# Patient Record
Sex: Male | Born: 1985
Health system: Southern US, Community
[De-identification: ages and names within clinical notes are randomized; demographics above are authoritative.]

## PROBLEM LIST (undated history)

## (undated) DIAGNOSIS — J302 Other seasonal allergic rhinitis: Secondary | ICD-10-CM

## (undated) DIAGNOSIS — F419 Anxiety disorder, unspecified: Secondary | ICD-10-CM

## (undated) DIAGNOSIS — F1910 Other psychoactive substance abuse, uncomplicated: Secondary | ICD-10-CM

## (undated) HISTORY — DX: Other seasonal allergic rhinitis: J30.2

## (undated) HISTORY — DX: Other psychoactive substance abuse, uncomplicated: F19.10

## (undated) HISTORY — DX: Anxiety disorder, unspecified: F41.9

---
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
## 2005-01-22 ENCOUNTER — Encounter: Admission: RE | Admit: 2005-01-22 | Discharge: 2005-01-22 | Payer: Self-pay | Admitting: Specialist

## 2006-08-31 IMAGING — RF DG FL GUIDE NDL PLMT  - NRPT [facility]
4 series · 4 of 4 positions shown · IV contrast (omniscan)
Comparison: none

CLINICAL DATA: Shoulder pain with tennis serving.  Assess for labral tear or cuff tear. 
 LEFT SHOULDER INJECTION FOR MRI:
 The skin anterior to the left shoulder was cleansed and anesthetized.  A 22 gauge spinal needle was directed into the shoulder joint.  10cc of fluid consisting of iodinated contrast, Omniscan, and 1% Lidocaine were injected.  I did filming of the shoulder and internal and external rotation including abduction films.  The undersurface of the rotator cuff appears smooth.

[Series 1: arthrogram · 1 of 1 slices shown (1 of 4)]
[im 1/1]
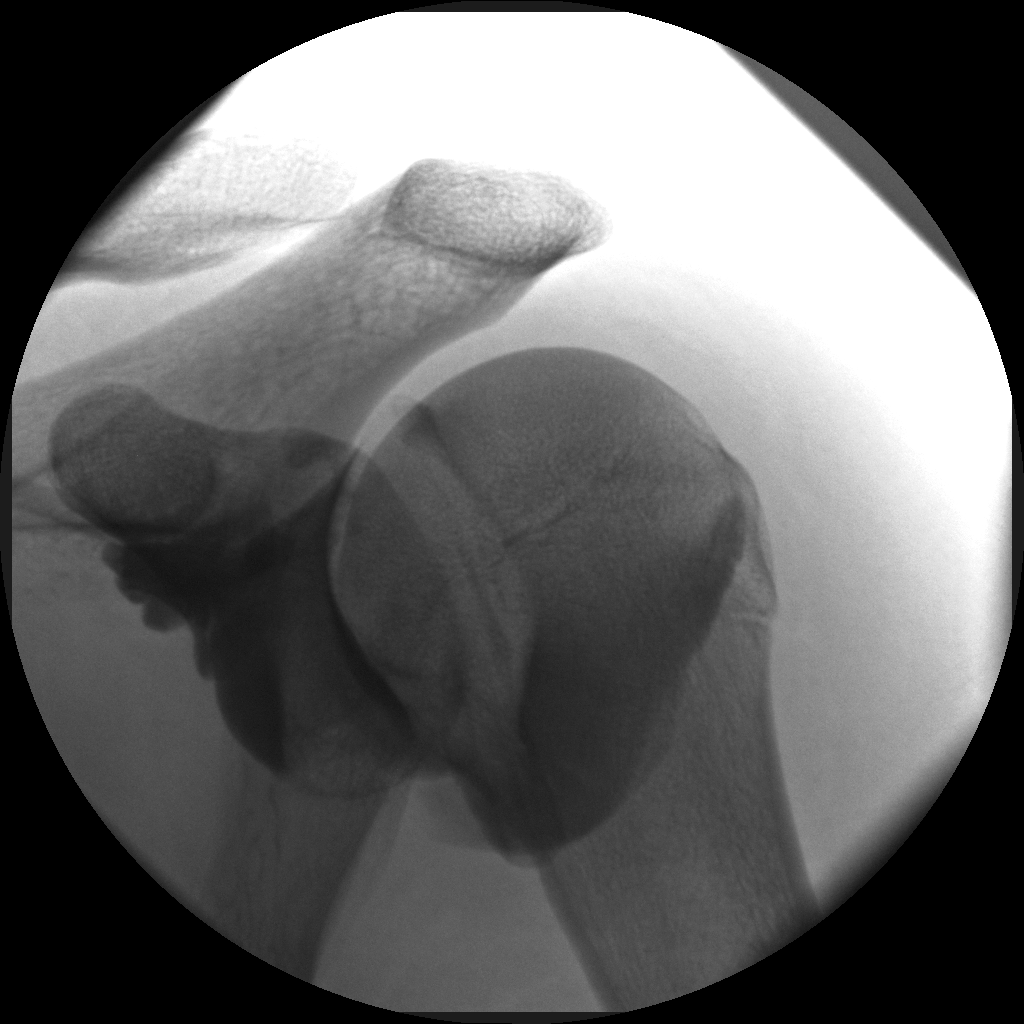

[Series 2: arthrogram · 1 of 1 slices shown (2 of 4)]
[im 1/1]
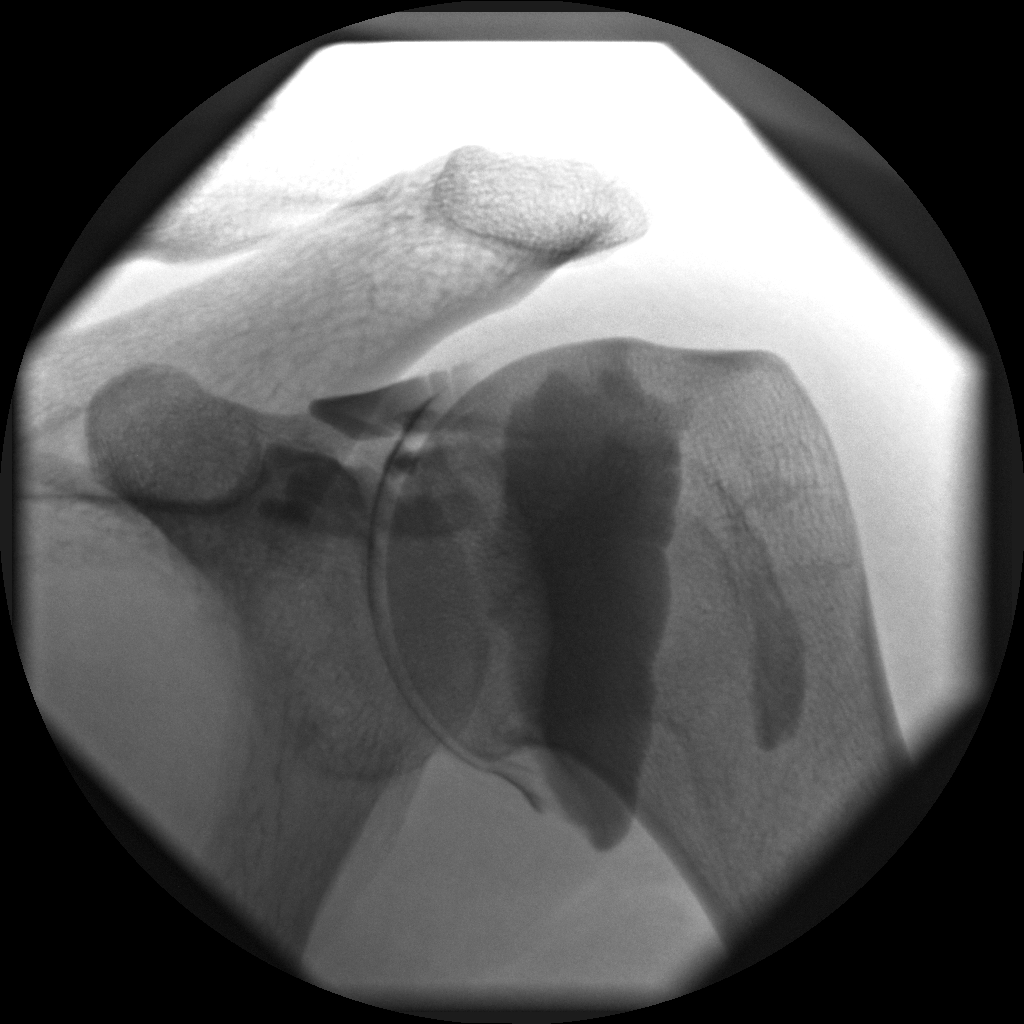

[Series 3: arthrogram · 1 of 1 slices shown (3 of 4)]
[im 1/1]
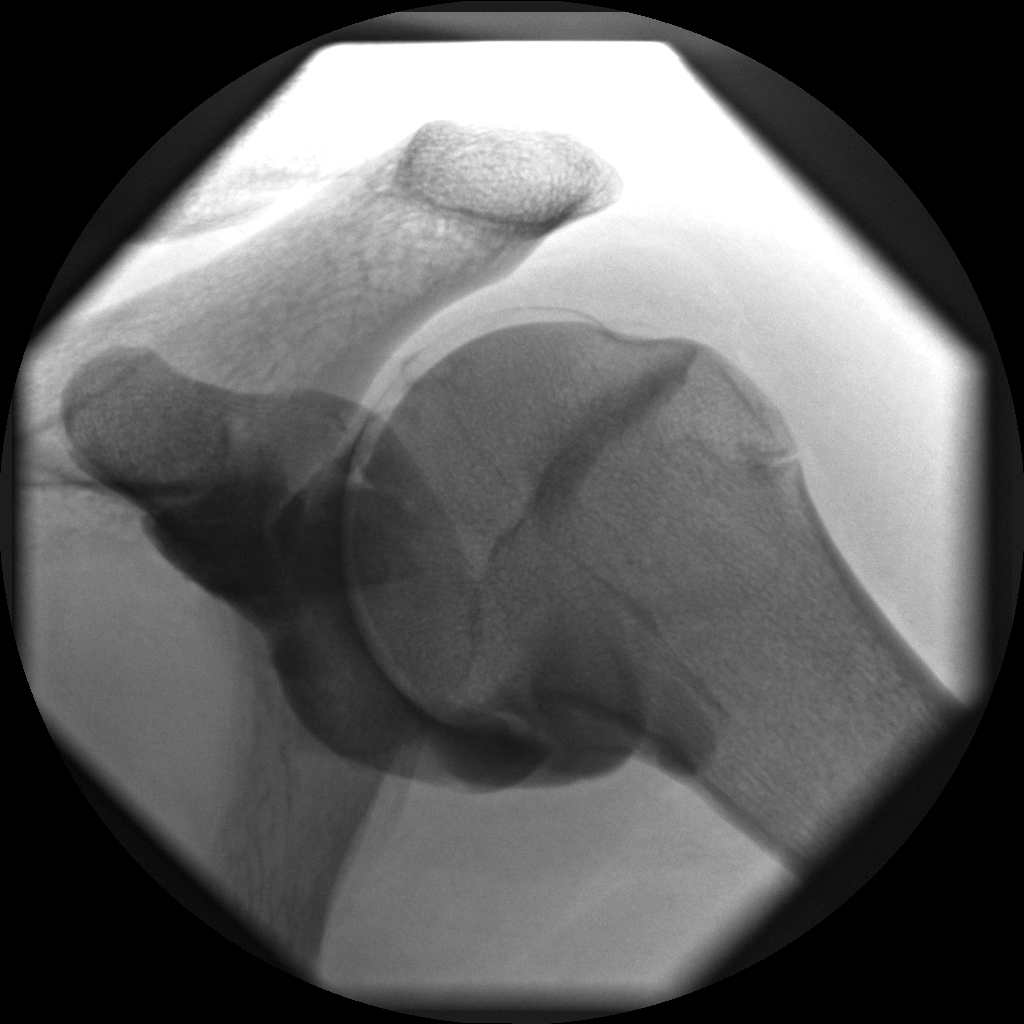

[Series 4: arthrogram · 1 of 1 slices shown (4 of 4)]
[im 1/1]
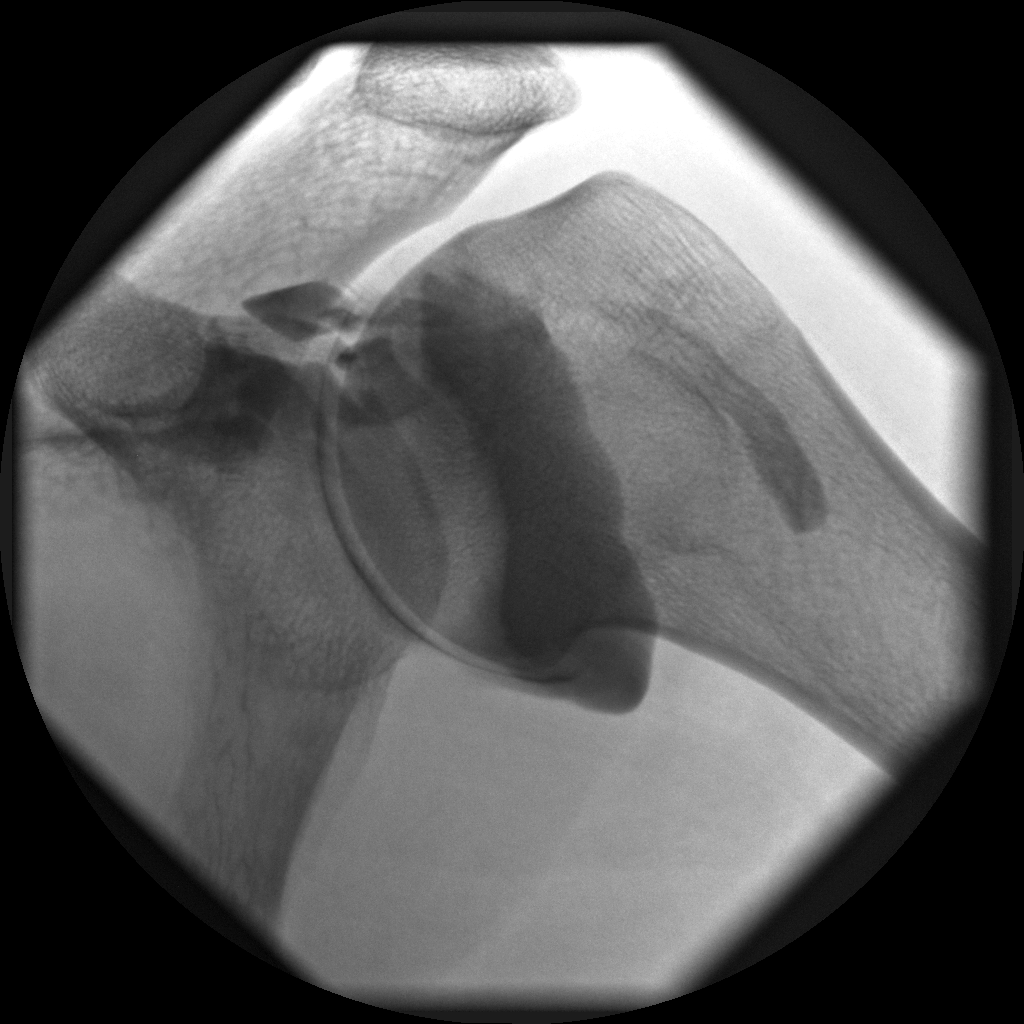

[4 of 4 positions shown; findings below may reference images not displayed]

IMPRESSION: 1.  Shoulder injection for MRI showing a normal undersurface of the rotator cuff.

## 2008-01-01 ENCOUNTER — Ambulatory Visit: Payer: Self-pay | Admitting: Family Medicine

## 2008-01-07 ENCOUNTER — Ambulatory Visit: Payer: Self-pay | Admitting: Family Medicine

## 2010-07-02 ENCOUNTER — Encounter: Payer: Self-pay | Admitting: Specialist

## 2014-05-27 ENCOUNTER — Encounter: Payer: Self-pay | Admitting: Family Medicine

## 2014-05-27 ENCOUNTER — Ambulatory Visit (INDEPENDENT_AMBULATORY_CARE_PROVIDER_SITE_OTHER): Payer: BLUE CROSS/BLUE SHIELD | Admitting: Family Medicine

## 2014-05-27 ENCOUNTER — Other Ambulatory Visit: Payer: Self-pay | Admitting: Family Medicine

## 2014-05-27 VITALS — BP 104/62 | HR 80 | Ht 69.0 in | Wt 208.0 lb

## 2014-05-27 DIAGNOSIS — R748 Abnormal levels of other serum enzymes: Secondary | ICD-10-CM

## 2014-05-27 DIAGNOSIS — F410 Panic disorder [episodic paroxysmal anxiety] without agoraphobia: Secondary | ICD-10-CM

## 2014-05-27 DIAGNOSIS — Z720 Tobacco use: Secondary | ICD-10-CM

## 2014-05-27 DIAGNOSIS — F411 Generalized anxiety disorder: Secondary | ICD-10-CM

## 2014-05-27 DIAGNOSIS — R002 Palpitations: Secondary | ICD-10-CM

## 2014-05-27 DIAGNOSIS — Z113 Encounter for screening for infections with a predominantly sexual mode of transmission: Secondary | ICD-10-CM

## 2014-05-27 DIAGNOSIS — Z23 Encounter for immunization: Secondary | ICD-10-CM

## 2014-05-27 DIAGNOSIS — K219 Gastro-esophageal reflux disease without esophagitis: Secondary | ICD-10-CM

## 2014-05-27 DIAGNOSIS — Z13220 Encounter for screening for lipoid disorders: Secondary | ICD-10-CM

## 2014-05-27 LAB — CBC WITH DIFFERENTIAL/PLATELET
BASOS ABS: 0 10*3/uL (ref 0.0–0.1)
Basophils Relative: 0 % (ref 0–1)
Eosinophils Absolute: 0.1 10*3/uL (ref 0.0–0.7)
Eosinophils Relative: 1 % (ref 0–5)
HEMATOCRIT: 45.2 % (ref 39.0–52.0)
HEMOGLOBIN: 15.5 g/dL (ref 13.0–17.0)
LYMPHS PCT: 37 % (ref 12–46)
Lymphs Abs: 2.2 10*3/uL (ref 0.7–4.0)
MCH: 28.5 pg (ref 26.0–34.0)
MCHC: 34.3 g/dL (ref 30.0–36.0)
MCV: 83.2 fL (ref 78.0–100.0)
MONOS PCT: 9 % (ref 3–12)
MPV: 10.8 fL (ref 9.4–12.4)
Monocytes Absolute: 0.5 10*3/uL (ref 0.1–1.0)
NEUTROS ABS: 3.2 10*3/uL (ref 1.7–7.7)
NEUTROS PCT: 53 % (ref 43–77)
PLATELETS: 216 10*3/uL (ref 150–400)
RBC: 5.43 MIL/uL (ref 4.22–5.81)
RDW: 13.3 % (ref 11.5–15.5)
WBC: 6 10*3/uL (ref 4.0–10.5)

## 2014-05-27 MED ORDER — ESCITALOPRAM OXALATE 10 MG PO TABS: 10.0000 mg | ORAL_TABLET | Freq: Every day | ORAL | Status: DC

## 2014-05-27 NOTE — Progress Notes (Signed)
Chief Complaint  Patient presents with  . Palpitations    over the past 2 months has been experiencing heart palpitations, thinks it may be from anxiety. Feels like his heart stops when he is sleeping. Has had panic attacks. Under lots of stress. [facility]states [facility] went [facility] and was given xanax-took two and problems went away.   Marland Kitchen. Heartburn    after he eats, always-wonders if he may have an ulcer. States [employer] are about the only thing that he doesn't have a problem after eating. Taking zantac otc.   Patient presents with complaints of anxiety and reflux.  His parents are both my patients, and he presents also to establish care.  He noticed some skipped beats at night while sleeping, which then made him anxious, and heart was beating fast.  Sometimes he felt like it "stopped" beating.  Once he even felt like his arms and head went numb, had trouble breathing, couldn't really move for a few seconds.  After this happened a couple of times, he had a full panic attack--he felt like he was going to pass out, his left arm was completely numb (as he was worried about having a heart attack). He had chest tightness, shortness of breath.  He sat down, slowly was breathing, and was able to calm down some.  After that, he was having issues while awake--had some palpitations, started getting very anxious, and that is when he went to the urgent care. He was given xanax to use at bedtime for anxiety.  He took it just twice, and his anxiety is much better. He is still very concerned that he has heart issues. He admits to being a worrier "like my father" and has some chronic, daily anxiety. 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  He took paxil for about 2 years for anxiety.  He has been off for about a year.  He weaned himself off of it because he felt like it brought him down too far--felt sleepy, more subdued  sexually. Had decreased libido. He cannot recall the dose.  He is having ongoing issues with heartburn, over the last 1.5 years.  He needs to take zantac every day, rarely needs to use a second pill.  Denies dysphagia. +caffeine, alcohol, acidic foods, eating late at night. 2 diet cokes/day (sometimes each is very large); caffeinated energy drink 4x/week.  Past Medical History  Diagnosis Date  . Anxiety    History reviewed. No pertinent past surgical history. History   Social History  . Marital Status: Single    Spouse Name: N/A    Number of Children: N/A  . Years of Education: N/A   Occupational History  . tennis coach, Secondary school teacherinstructor of public speaking at [employer]    Social History Main Topics  . Smoking status: Current Every Day Smoker -- 0.30 packs/day    Types: Cigarettes  . Smokeless tobacco: Never Used     Comment: down to 4-6 cigarettes/day; started smoking age 28  . Alcohol Use: 0.0 oz/week    0 Not specified per week     Comment: 2-3 days a week:4-5 drinks each time  . Drug Use: No  . Sexual Activity:    Partners: Male   Other Topics Concern  . Not on file   Social History Narrative   Lives with male partner.  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   No pets.   +smoker   Family History  Problem Relation Age of Onset  . Hypertension Mother   . Transient ischemic attack Father   . Heart disease Maternal Grandfather   . Cancer Paternal Grandmother    Outpatient Encounter Prescriptions as of 05/27/2014  Medication Sig Note  . ALPRAZolam (XANAX) 0.5 MG tablet Take 0.5 mg by mouth daily.  05/27/2014: From [facility] about 1 week ago, rx'd #30.  Has taken it x 2 nights, at bedtime  . ibuprofen (ADVIL,MOTRIN) 200 MG tablet Take 600 mg by mouth as needed. 05/27/2014: Takes about once a week.  . ranitidine (ZANTAC) 150 MG tablet Take 150 mg by mouth daily.   . simethicone (MYLICON) 80 MG chewable tablet Chew  80 mg by mouth every 6 (six) hours as needed for flatulence.    No Known Allergies  ROS:  No fevers, chills, headaches, chest pain (except with panic attack).  Numbness/tingling only related to anxiety/hyperventilation.  Denies URI symptoms, cough, shortness of breath with exertion.  +anxiety and reflux as per HPI. He has concern over possible ulcer--he doesn't have any abdominal pain, or symptoms on empty stomach; no black/bloody bowel movements.  No dysphagia. No nausea, vomiting, diarrhea.  Denies depression. No bleeding, bruising, skin lesions/rashes. No swollen glands, joint pain (other than mild chronic issues related to overuse from tennis--no acute problem).  PHYSIAL EXAM: BP 104/62 mmHg  Pulse 80  Ht 5\' 9"  (1.753 m)  Wt 208 lb (94.348 kg)  BMI 30.70 kg/m2 Pleasant, talkative overweight male--only mildly anxious HEENT: PERRL, EOMI, conjunctiva clear.  OP clear. Neck: no lymphadenopathy, thyromegaly or mass Heart: regular rate and rhythm without murmur Lungs: clear bilaterally Back: no CVA or spinal tenderness Abdomen: soft, nontender, no organomegaly or mass Extremities: no edema, 2+ pulse Psych: normal affect, hygiene and grooming.  Normal eye contact and speech Neuro: alert and oriented.  Cranial nerves intact.  2+ DTR's, normal strength, sensation, gait  ASSESSMENT/PLAN:  Generalized anxiety disorder - encouraged counseling, stress reduction techniques. use xanax sparingly.  start lexapro--risks/side effects reviewed.  f/u 4-6 wks - Plan: escitalopram (LEXAPRO) 10 MG tablet  Need for prophylactic vaccination and inoculation against influenza - Plan: Flu Vaccine QUAD 36+ mos PF IM (Fluarix Quad PF)  Palpitations - Plan: Comprehensive metabolic panel, TSH, CBC with Differential  Panic attack  Screening for lipid disorders - Plan: Lipid panel  Screening for STD (sexually transmitted disease) - Plan: HIV antibody (with reflex)  Gastroesophageal reflux disease without  esophagitis - reviewed diet and behavioral changes in detail, as well as OTC meds (PPI vs H2).  Tobacco abuse - reviewed risks; encouraged cessation.  counseled extensively re: available options to help quit, risks, and risks of e-cigs  Counseling encouraged (through [employer]) Counseled extensively re: stress reduction, alprazolam risks, alcohol risks/recommendations, smoking risks and cessation. Counseled re: SSRI risks/side effects. Counseled re: reflux precautions, diet, risks of untreated reflux  lexapro 10mg --Start at 1/2 tablet once daily. After a week, increase to the full tablet.  If you don't tolerate the full tablet, you can continue with 1/2 tablet for longer.  Use the alprazolam/xanax sparingly, only for severe anxiety.  If you need to take it during the day, only use 1/2 tablet and use caution with driving.   F/u as scheduled at [facility] in February, sooner if needed  Total visit time was 50 minutes, more than 1/2 spent counseling

## 2014-05-27 NOTE — Patient Instructions (Signed)
  Lexapro 10mg --start at 1/2 tablet once daily. After a week, increase to the full tablet.  If you don't tolerate the full tablet, you can continue with 1/2 tablet for longer.  Use the alprazolam/xanax sparingly, only for severe anxiety.  If you need to take it during the day, only use 1/2 tablet and use caution with driving.  Please try and quit smoking--start thinking about why/when you smoke (habit, boredom, stress) in order to come up with effective strategies to cut back or quit. Available resources to help you quit include free counseling through [facility] ([place]uit[place] or 1-800-QUITNOW), smoking cessation classes through [facility] (call to find out schedule), over-the-counter nicotine replacements, and e-cigarettes (although this may not help break the hand-mouth habit).  Many insurance companies also have smoking cessation programs (which may decrease the cost of patches, meds if enrolled).  If these methods are not effective for you, and you are motivated to quit, return to discuss the possibility of prescription medications.  Food Choices for Gastroesophageal Reflux Disease When you have gastroesophageal reflux disease (GERD), the foods you eat and your eating habits are very important. Choosing the right foods can help ease the discomfort of GERD. WHAT GENERAL GUIDELINES DO I NEED TO FOLLOW?  Choose fruits, vegetables, whole grains, low-fat dairy products, and low-fat meat, fish, and poultry.  Limit fats such as oils, salad dressings, butter, nuts, and avocado.  Keep a food diary to identify foods that cause symptoms.  Avoid foods that cause reflux. These may be different for different people.  Eat frequent small meals instead of three large meals each day.  Eat your meals slowly, in a relaxed setting.  Limit fried foods.  Cook foods using methods other than frying.  Avoid drinking alcohol.  Avoid drinking large amounts of liquids with your  meals.  Avoid bending over or lying down until 2-3 hours after eating. WHAT FOODS ARE NOT RECOMMENDED? The following are some foods and drinks that may worsen your symptoms: Vegetables Tomatoes. Tomato juice. Tomato and spaghetti sauce. Chili peppers. Onion and garlic. Horseradish. Fruits Oranges, grapefruit, and lemon (fruit and juice). Meats High-fat meats, fish, and poultry. This includes hot dogs, ribs, ham, sausage, salami, and bacon. Dairy Whole milk and chocolate milk. Sour cream. Cream. Butter. Ice cream. Cream cheese.  Beverages Coffee and tea, with or without caffeine. Carbonated beverages or energy drinks. Condiments Hot sauce. Barbecue sauce.  Sweets/Desserts Chocolate and cocoa. Donuts. Peppermint and spearmint. Fats and Oils High-fat foods, including [place] fries and potato chips. Other Vinegar. Strong spices, such as black pepper, white pepper, red pepper, cayenne, curry powder, cloves, ginger, and chili powder. The items listed above may not be a complete list of foods and beverages to avoid. Contact your dietitian for more information. Document Released: 05/28/2005 Document Revised: 06/02/2013 Document Reviewed: 04/01/2013 [facility] Patient Information 2015 [employer] [place]. This information is not intended to replace advice given to you by your health care provider. Make sure you discuss any questions you have with your health care provider.

## 2014-05-28 DIAGNOSIS — Z720 Tobacco use: Secondary | ICD-10-CM | POA: Insufficient documentation

## 2014-05-28 DIAGNOSIS — F410 Panic disorder [episodic paroxysmal anxiety] without agoraphobia: Secondary | ICD-10-CM | POA: Insufficient documentation

## 2014-05-28 DIAGNOSIS — F411 Generalized anxiety disorder: Secondary | ICD-10-CM | POA: Insufficient documentation

## 2014-05-28 DIAGNOSIS — K219 Gastro-esophageal reflux disease without esophagitis: Secondary | ICD-10-CM | POA: Insufficient documentation

## 2014-05-28 DIAGNOSIS — R748 Abnormal levels of other serum enzymes: Secondary | ICD-10-CM | POA: Insufficient documentation

## 2014-05-28 LAB — LIPID PANEL
CHOL/HDL RATIO: 3.3 ratio
CHOLESTEROL: 158 mg/dL (ref 0–200)
HDL: 48 mg/dL (ref >39)
LDL CALC: 92 mg/dL (ref 0–99)
TRIGLYCERIDES: 92 mg/dL (ref <150)
VLDL: 18 mg/dL (ref 0–40)

## 2014-05-28 LAB — COMPREHENSIVE METABOLIC PANEL
ALBUMIN: 4.2 g/dL (ref 3.5–5.2)
ALT: 64 U/L — AB (ref 0–53)
AST: 184 U/L — AB (ref 0–37)
Alkaline Phosphatase: 65 U/L (ref 39–117)
BUN: 12 mg/dL (ref 6–23)
CO2: 25 meq/L (ref 19–32)
Calcium: 9.5 mg/dL (ref 8.4–10.5)
Chloride: 106 mEq/L (ref 96–112)
Creat: 1 mg/dL (ref 0.50–1.35)
GLUCOSE: 86 mg/dL (ref 70–99)
POTASSIUM: 4.6 meq/L (ref 3.5–5.3)
Sodium: 139 mEq/L (ref 135–145)
TOTAL PROTEIN: 6.9 g/dL (ref 6.0–8.3)
Total Bilirubin: 0.6 mg/dL (ref 0.2–1.2)

## 2014-05-28 LAB — HIV ANTIBODY (ROUTINE TESTING W REFLEX): HIV: NONREACTIVE

## 2014-05-28 LAB — TSH: TSH: 1.516 u[IU]/mL (ref 0.350–4.500)

## 2014-05-28 NOTE — Addendum Note (Signed)
Addended byJoselyn Arrow: Kelcie Currie on: 05/28/2014 02:47 PM   Modules accepted: Orders

## 2014-05-31 LAB — HEPATITIS C ANTIBODY: HCV AB: NEGATIVE

## 2014-05-31 LAB — HEPATITIS B SURFACE ANTIBODY,QUALITATIVE

## 2014-05-31 LAB — HEPATITIS B SURFACE ANTIGEN: HEP B S AG: NEGATIVE

## 2014-08-09 ENCOUNTER — Encounter: Payer: Self-pay | Admitting: Family Medicine

## 2014-08-09 DIAGNOSIS — Z0000 Encounter for general adult medical examination without abnormal findings: Secondary | ICD-10-CM

## 2014-09-06 ENCOUNTER — Encounter: Payer: Self-pay | Admitting: Family Medicine

## 2014-11-02 ENCOUNTER — Telehealth: Payer: Self-pay | Admitting: Family Medicine

## 2014-11-02 NOTE — Telephone Encounter (Signed)
No.  He can schedule a sooner appointment (there are openings 6/1, or we can do a 30 min visit this Friday. He no showed his physical, so never followed up on Lexapro since it was started in December

## 2014-11-02 NOTE — Telephone Encounter (Signed)
[facility]

## 2014-11-02 NOTE — Telephone Encounter (Signed)
Pt says Lexapro is not working. He would like to get script for Paroxetine if possible so made an appt to see Dr Lynelle DoctorKnapp to discuss this. Wants to know if he can get a few Xanax again for the time being for panic attacks.

## 2014-11-05 ENCOUNTER — Encounter: Payer: Self-pay | Admitting: Family Medicine

## 2014-11-05 ENCOUNTER — Ambulatory Visit (INDEPENDENT_AMBULATORY_CARE_PROVIDER_SITE_OTHER): Payer: BLUE CROSS/BLUE SHIELD | Admitting: Family Medicine

## 2014-11-05 VITALS — BP 124/72 | HR 60 | Ht 69.0 in | Wt 198.6 lb

## 2014-11-05 DIAGNOSIS — F411 Generalized anxiety disorder: Secondary | ICD-10-CM | POA: Diagnosis not present

## 2014-11-05 DIAGNOSIS — R748 Abnormal levels of other serum enzymes: Secondary | ICD-10-CM

## 2014-11-05 DIAGNOSIS — K219 Gastro-esophageal reflux disease without esophagitis: Secondary | ICD-10-CM

## 2014-11-05 DIAGNOSIS — Z720 Tobacco use: Secondary | ICD-10-CM

## 2014-11-05 MED ORDER — ALPRAZOLAM 0.5 MG PO TABS: 0.5000 mg | ORAL_TABLET | Freq: Three times a day (TID) | ORAL | Status: DC | PRN

## 2014-11-05 MED ORDER — BUSPIRONE HCL 15 MG PO TABS: ORAL_TABLET | ORAL | Status: DC

## 2014-11-05 MED ORDER — DEXLANSOPRAZOLE 60 MG PO CPDR: 60.0000 mg | DELAYED_RELEASE_CAPSULE | Freq: Every day | ORAL | Status: DC

## 2014-11-05 NOTE — Progress Notes (Signed)
Chief Complaint  Patient presents with  . Follow-up    would like to have repeat liver tests possibly by next Friday.  Also to follow up on anxiety/panic attacks-has worsened since last visit.    After he eats, especially after a large meal, or an unhealthy meal, he gets a discomfort in his chest, feels like the food isn't being digested properly.  He stands up, and keeps burping, until it clears.  Drinking water doesn't help, sometimes makes it worse, if he is already full. He feels like this triggers anxiety.  He also has anxiety when he tries to lay down to sleep--he needs to rub the chest, and get up and walk around to "work it out".  Sometimes he walks around until morning.  This has been going on for 2-2.5 weeks.  He has been using the zantac just once or twice a week--helps some with the burning, but not helping with the discomfort lower in the chest or the anxiety. Heart rate sometimes speeds up some when feeling the discomfort and anxiety--not extremely fast or irregular.  He is feeling anxious all the time.  He has woken up twice with anxiety, and then it tends to last throughout the day.  He uses "anxiety" and the chest discomfort fairly synonymously in talking today, but then also describes feeling like it is hard to take a breath, chest is tight, heart beats faster.  He can sometimes talk himself out of it, so is aware it is "all in his head".  Once felt like his equilibrium was a little off, which made him feel more anxious.  We started him on Lexapro in December for the same concerns. He took it only for about 5-6 weeks, and then stopped it because he felt like it made him more depressed.  He felt "nothing in the world was good."  No longer having any problems with feeling down.  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 Always able to function, even during finals.  But as everything wraps up, he gets more anxious, has trouble sleeping.  Same thing happened  with end of semester in the past.  He took paxil for about 2 years for anxiety. He has been off for over a year and a half. He weaned himself off of it because he felt like it brought him down too far--felt sleepy, more subdued sexually. Had decreased libido. He cannot recall the dose. He had originally called asking to get switched back to Paxil to treat his anxiety (until reminded of why he told me he stopped it).  He continues to smoke a few cigarettes a day, more if drinking. Trying to use e-cigarette more.  He continues to drink the same amount of alcohol. He never returned for repeat testing, was waiting until he had a time that he didn't drink much alcohol for 2 weeks. Last drink was 3 shots 3 days ago.  He was supposed to return IN 2 weeks, and to avoid drinking for those 2 weeks, to repeat liver tests (in January).  He misunderstood, and was waiting until there was a time that he didn't drink for 2 weeks (which hasn't happened).  He otherwise only drinks water, other fluids make him have acid reflux. His diet has been healthier, getting regular exercise.  PMH, PSH, SH, FH reviewed/updated  Outpatient Encounter Prescriptions as of 11/05/2014  Medication Sig Note  . ALPRAZolam (XANAX) 0.5 MG tablet Take 0.5 mg by mouth daily.  05/27/2014: From [facility] about 1 week ago, rx'd #30.  Has taken it x 2 nights, at bedtime  . escitalopram (LEXAPRO) 10 MG tablet Take 1 tablet (10 mg total) by mouth daily. (Patient not taking: Reported on 11/05/2014) 11/05/2014: Only took it for 5-6 weeks  . ibuprofen (ADVIL,MOTRIN) 200 MG tablet Take 600 mg by mouth as needed. 05/27/2014: Takes about once a week.  . ranitidine (ZANTAC) 150 MG tablet Take 150 mg by mouth daily. 11/05/2014: Uses prn, once or twice a week. Needing less since eating better  . simethicone (MYLICON) 80 MG chewable tablet Chew 80 mg by mouth every 6 (six) hours as needed for flatulence. 11/05/2014: Uses prn   No  facility-administered encounter medications on file as of 11/05/2014.   No Known Allergies  ROS: no headaches, dizziness, fainting spells, numbness, tingling, URI symptoms, shortness of breath (except associated with palpitations and anxiety).  No vomiting or diarrhea, + reflux as per HPI. No urinary complaints. No bleeding, bruising, rash. Down 10# since his last visit, eating healthier,exercising more  PHYSICAL EXAM: BP 124/72 mmHg  Pulse 60  Ht  (1.753 m)  Wt 198 lb 9.6 oz (90.084 kg)  BMI 29.31 kg/m2 Well developed, pleasant, somewhat anxious, talkative male in no distress HEENT: PERRL, EOMI, conjunctiva clear. OP: mild erythema of anterior tonsillar pillars bilaterally, otherwise normal. Heart: regular rate and rhythm without murmur Lungs: clear bilateraelly Abdomen: soft, no organomegaly or mass. Mild epigastric tenderness Extremities: no edema Neuro: alert and oriented.  Cranial nerves, strength and gait are normal Psych: slightly anxious. Full range of affect. Normal eye contact, speech, hygiene and grooming  ASSESSMENT/PLAN:  Generalized anxiety disorder - intolerant of Lexapro and Paxil. Try Buspar, encouraged counseling. - Plan: busPIRone (BUSPAR) 15 MG tablet, ALPRAZolam (XANAX) 0.5 MG tablet  Gastroesophageal reflux disease, esophagitis presence not specified - diet reviewed; decrease alcohol, smoking; use Dexilant daily x2 wks, then OTC PPI prn. chest pain likely triggering anxiety - Plan: dexlansoprazole (DEXILANT) 60 MG capsule  Elevated liver enzymes - repeat next week (declines today); risks of excessive alcohol reviewed in detail, encouraged to decrease  Tobacco abuse - strongly encouraged cessation   Buspar . Start at 1/2 tablet twice daily (if too many side effects, can decrease to 1/3 tablet ( ) twice daily, and gradually increase to 3/day, and increase the dose by 5-7.5mg  each week until you get to  twice daily.  Hold it at  twice daily, and  then follow up. Call sooner if you aren't tolerating or getting any benefit at all.  This is for the treatment of anxiety.  It is a completely different class of medication than the Paxil or Lexapro.  I highly recommend counseling to help you learn coping skills for when you are feeling anxious.  Use the alprazolam only if needed for a panic attack, or to help decrease the anxiety so you can fall asleep.  This is not intended for regular use.  Reflux--I recommend taking Dexilant once daily (in the morning, before breakfast) every day for the length of the samples.  If your symptoms are improving (heartburn, chest pressure, trouble with eating) then that means that acid reflux is a contributing factor.  When you finish the samples, start taking an over-the-counter Nexium or Prilosec once daily, every day.  You don't need to take the zantac while on these other medication.  If the OTC medication isn't effective, then call us for a prescription--the prescription will be for Nexium , not  for Dexilant simply because the dexilant won't be covered.  Continue proper diet for reflux.  Smoking cessation reviewed Alcohol recommendations reviewed.  Please quit smoking. Please cut back on drinking.   Both of these affect your reflux The drinking is also affecting/harming your liver, and can interfere with sleep.

## 2014-11-05 NOTE — Patient Instructions (Addendum)
Buspar . Start at 1/2 tablet twice daily (if too many side effects, can decrease to 1/3 tablet ( ) twice daily, and gradually increase to 3/day, and increase the dose by 5-7.5mg  each week until you get to  twice daily.  Hold it at  twice daily, and then follow up. Call sooner if you aren't tolerating or getting any benefit at all.  This is for the treatment of anxiety.  It is a completely different class of medication than the Paxil or Lexapro.  I highly recommend counseling to help you learn coping skills for when you are feeling anxious.  Use the alprazolam only if needed for a panic attack, or to help decrease the anxiety so you can fall asleep.  This is not intended for regular use.  Reflux--I recommend taking Dexilant once daily (in the morning, before breakfast) every day for the length of the samples.  If your symptoms are improving (heartburn, chest pressure, trouble with eating) then that means that acid reflux is a contributing factor.  When you finish the samples, start taking an over-the-counter Nexium or Prilosec once daily, every day.  You don't need to take the zantac while on these other medication.  If the OTC medication isn't effective, then call us for a prescription--the prescription will be for Nexium , not for Dexilant simply because the dexilant won't be covered.  Continue proper diet for reflux.  Food Choices for Gastroesophageal Reflux Disease When you have gastroesophageal reflux disease (GERD), the foods you eat and your eating habits are very important. Choosing the right foods can help ease the discomfort of GERD. WHAT GENERAL GUIDELINES DO I NEED TO FOLLOW?  Choose fruits, vegetables, whole grains, low-fat dairy products, and low-fat meat, fish, and poultry.  Limit fats such as oils, salad dressings, butter, nuts, and avocado.  Keep a food diary to identify foods that cause symptoms.  Avoid foods that cause reflux. These may be different for  different people.  Eat frequent small meals instead of three large meals each day.  Eat your meals slowly, in a relaxed setting.  Limit fried foods.  Cook foods using methods other than frying.  Avoid drinking alcohol.  Avoid drinking large amounts of liquids with your meals.  Avoid bending over or lying down until 2-3 hours after eating. WHAT FOODS ARE NOT RECOMMENDED? The following are some foods and drinks that may worsen your symptoms: Vegetables Tomatoes. Tomato juice. Tomato and spaghetti sauce. Chili peppers. Onion and garlic. Horseradish. Fruits Oranges, grapefruit, and lemon (fruit and juice). Meats High-fat meats, fish, and poultry. This includes hot dogs, ribs, ham, sausage, salami, and bacon. Dairy Whole milk and chocolate milk. Sour cream. Cream. Butter. Ice cream. Cream cheese.  Beverages Coffee and tea, with or without caffeine. Carbonated beverages or energy drinks. Condiments Hot sauce. Barbecue sauce.  Sweets/Desserts Chocolate and cocoa. Donuts. Peppermint and spearmint. Fats and Oils High-fat foods, including [place] fries and potato chips. Other Vinegar. Strong spices, such as black pepper, white pepper, red pepper, cayenne, curry powder, cloves, ginger, and chili powder. The items listed above may not be a complete list of foods and beverages to avoid. Contact your dietitian for more information. Document Released: 05/28/2005 Document Revised: 06/02/2013 Document Reviewed: 04/01/2013 [facility] Patient Information 2015 [place], [place]. This information is not intended to replace advice given to you by your health care provider. Make sure you discuss any questions you have with your health care provider.   Please quit smoking. Please cut back on drinking.  Both of these affect your reflux The drinking is also affecting/harming your liver, and can interfere with sleep.

## 2014-11-12 ENCOUNTER — Telehealth: Payer: Self-pay | Admitting: Family Medicine

## 2014-11-12 NOTE — Telephone Encounter (Signed)
Pt. Called in stating that the ALPRAZOLAM (xanax) 0.5mg  is having no effect for his anxiety/panic attacks. Pt. Also stated that he had took two of the pills at one time and still no effect. He is wanting to know should he come in for a possible medication adjustment? Pt. Was last seen 5/27.   Pt. Also stated that the DEXLANSOPRAZOLE (dexilant) 60mg  cap. Works [employer]. He knows his insurance will not cover the name brand. Pt. States he is on his last 6 pills and would like to know what could be an alternative to the medication?  Pt. Contact # [phone]

## 2014-11-12 NOTE — Telephone Encounter (Signed)
As stated in his after visit summary, after finishing samples he is to try OTC Prilosec or Nexium. If the OTC dose isn't effective, he can double up on it, and call us for prescription of Nexium 40mg  or omeprazole 40mg  (the double dose, if it is effective for him).  Please have him read the AVS, which should also state direcions for increasing the Buspar.  It takes time for the buspar to start working--he was supposed to start at 1/2 tablet twice daily, and gradually increase up to full Pill twice daily,  Have him continue to titrate up the dose of buspar up to full tab BID as directed at his last visit

## 2014-11-12 NOTE — Telephone Encounter (Signed)
I spoke with the patient and I went over the message in full detail from Dr. Lynelle DoctorKnapp and her recommendations. The patient states he understood and he also said he was following instructions as far as the Buspar.  Patient wanted to know if there was another option as far as the Xanax. He states that he has taken 2 an its not helping the anxiety level.

## 2014-11-12 NOTE — Telephone Encounter (Signed)
LMOM TO CB. CLS 

## 2014-11-12 NOTE — Telephone Encounter (Signed)
I do not recommend taking more than 2 tablets during the day.  Can take 3 in the evening (with no alcohol not driving) if needed

## 2014-11-15 NOTE — Telephone Encounter (Signed)
Left detailed message for pt on his vm

## 2014-11-17 ENCOUNTER — Telehealth: Payer: Self-pay | Admitting: Family Medicine

## 2014-11-17 ENCOUNTER — Ambulatory Visit: Payer: BLUE CROSS/BLUE SHIELD | Admitting: Family Medicine

## 2014-11-17 DIAGNOSIS — F411 Generalized anxiety disorder: Secondary | ICD-10-CM

## 2014-11-17 MED ORDER — ALPRAZOLAM 0.5 MG PO TABS: 0.5000 mg | ORAL_TABLET | Freq: Three times a day (TID) | ORAL | Status: DC | PRN

## 2014-11-17 MED ORDER — PAROXETINE HCL 20 MG PO TABS: ORAL_TABLET | ORAL | Status: DC

## 2014-11-17 NOTE — Telephone Encounter (Signed)
Pt called and requested a refill of xanax. Pt states he is going out of town and would like to be able to take with him as he is running low. Pt also requesting a "personal phone call from his doctor".  Pt was offered an appt but declined stating "he doesn't have time or money for that" pt stated " he was tired of getting calls from other people and wanted to speak to his doctor directly." Pt seemed agitated. Pt uses [facility] and can be reached at [phone].

## 2014-11-17 NOTE — Telephone Encounter (Signed)
Last refilled xanax #20 on 5/27. Taking 1/2 tablet TID.  His head feels weird "in the clouds" for 30 minutes after taking a whole pill. He doesn't notice any significant improvement.  Only has been feeling better when he takes 2 of the alprazolam at a time.  Doesn't feel anxious when busy, active, but still feels very anxious when still.  He hasn[facility].  I strongly encouraged counseling; mentioned [employer] app for him to look into.  Given that he isn't tolerating the buspar well, and knows how the paxil made him feel in the past, he prefers to go back to the paxil. Advised to start 1/2 tablet each evening, and increase to full tablet after a week. Continue to use alprazolam if needed (short-term); refill called in.  Pt to f/u in a month, sooner if needed.

## 2014-12-17 ENCOUNTER — Telehealth: Payer: Self-pay | Admitting: Family Medicine

## 2014-12-17 ENCOUNTER — Other Ambulatory Visit: Payer: Self-pay

## 2014-12-17 DIAGNOSIS — F411 Generalized anxiety disorder: Secondary | ICD-10-CM

## 2014-12-17 MED ORDER — ALPRAZOLAM 0.5 MG PO TABS: 0.5000 mg | ORAL_TABLET | Freq: Three times a day (TID) | ORAL | Status: DC | PRN

## 2014-12-17 NOTE — Telephone Encounter (Signed)
Pt informed and said he would call back and make that appointment med called in

## 2014-12-17 NOTE — Telephone Encounter (Signed)
Pt needs refill Alprazolam to [facility]

## 2014-12-17 NOTE — Telephone Encounter (Signed)
Okay to refill but encourage him to set up a follow-up appointment with Dr. Lynelle DoctorKnapp

## 2015-01-19 ENCOUNTER — Telehealth: Payer: Self-pay | Admitting: *Deleted

## 2015-01-19 DIAGNOSIS — F411 Generalized anxiety disorder: Secondary | ICD-10-CM

## 2015-01-19 MED ORDER — PAROXETINE HCL 20 MG PO TABS
ORAL_TABLET | ORAL | Status: DC
Start: 2015-01-19 — End: 2016-10-12

## 2015-01-19 MED ORDER — ALPRAZOLAM 0.5 MG PO TABS: 0.5000 mg | ORAL_TABLET | Freq: Three times a day (TID) | ORAL | Status: DC | PRN

## 2015-01-19 NOTE — Telephone Encounter (Signed)
Patient called requesting refills on paxil and xanax.

## 2015-01-19 NOTE — Telephone Encounter (Signed)
He needs to schedule OV (as also recommended when his xanax was refilled last 7/8 by Dr. Susann Givens #20).  Okay to refill Paxil #30, deny xanax refill.  Needs OV

## 2015-01-19 NOTE — Telephone Encounter (Signed)
Spoke with patient and sent refill on Paxil. He has not scheduled appointment, he is in training all next week and cannot make it in until the 22nd at best. He is concerned that he cannot have the xanax refilled and feels he may need it before the 22nd (appt is not yet scheduled), he wants to know if you can call him in a small rx to make it until the 22nd-he also mentioned that he did not know that his xanax rx was on a month to month basis. (not really sure what that means).Please advise.

## 2015-01-19 NOTE — Telephone Encounter (Signed)
Xanax is not a long-term medication. It was to be used short-term, until the paxil adequately controls his anxiety, which is why he is due to follow up to discuss his paxil dose.  It is not intended to be used regularly.  He can have #15, no additional refills without visit (last given #20 on 7/8, so that should be plenty)

## 2015-01-19 NOTE — Telephone Encounter (Signed)
Left message for patient to call me back. 

## 2015-09-22 ENCOUNTER — Encounter (HOSPITAL_BASED_OUTPATIENT_CLINIC_OR_DEPARTMENT_OTHER): Payer: Self-pay | Admitting: Emergency Medicine

## 2015-09-22 ENCOUNTER — Emergency Department (HOSPITAL_BASED_OUTPATIENT_CLINIC_OR_DEPARTMENT_OTHER)
Admission: EM | Admit: 2015-09-22 | Discharge: 2015-09-22 | Disposition: A | Discharge status: Home / Self Care | Payer: BLUE CROSS/BLUE SHIELD | Attending: Emergency Medicine | Admitting: Emergency Medicine

## 2015-09-22 DIAGNOSIS — Z23 Encounter for immunization: Secondary | ICD-10-CM | POA: Diagnosis not present

## 2015-09-22 DIAGNOSIS — M542 Cervicalgia: Secondary | ICD-10-CM | POA: Insufficient documentation

## 2015-09-22 DIAGNOSIS — S80212A Abrasion, left knee, initial encounter: Secondary | ICD-10-CM | POA: Insufficient documentation

## 2015-09-22 DIAGNOSIS — Y939 Activity, unspecified: Secondary | ICD-10-CM | POA: Insufficient documentation

## 2015-09-22 DIAGNOSIS — F17210 Nicotine dependence, cigarettes, uncomplicated: Secondary | ICD-10-CM | POA: Diagnosis not present

## 2015-09-22 DIAGNOSIS — Y929 Unspecified place or not applicable: Secondary | ICD-10-CM | POA: Insufficient documentation

## 2015-09-22 DIAGNOSIS — S8011XA Contusion of right lower leg, initial encounter: Secondary | ICD-10-CM | POA: Insufficient documentation

## 2015-09-22 DIAGNOSIS — Y999 Unspecified external cause status: Secondary | ICD-10-CM | POA: Insufficient documentation

## 2015-09-22 DIAGNOSIS — S8991XA Unspecified injury of right lower leg, initial encounter: Secondary | ICD-10-CM | POA: Diagnosis present

## 2015-09-22 MED ORDER — NAPROXEN 500 MG PO TABS: 500.0000 mg | ORAL_TABLET | Freq: Two times a day (BID) | ORAL | Status: DC

## 2015-09-22 MED ORDER — TETANUS-DIPHTH-ACELL PERTUSSIS 5-2.5-18.5 LF-MCG/0.5 IM SUSP
0.5000 mL | Freq: Once | INTRAMUSCULAR | Status: AC
  Administered 2015-09-22: 0.5 mL via INTRAMUSCULAR
  Filled 2015-09-22: qty 0.5

## 2015-09-22 MED ORDER — METHOCARBAMOL 500 MG PO TABS: 500.0000 mg | ORAL_TABLET | Freq: Two times a day (BID) | ORAL | Status: DC

## 2015-09-22 MED FILL — METHOCARBAMOL 500 MG TABLET: 500 | 10 days supply | Qty: 20 | Fill #0

## 2015-09-22 MED FILL — NAPROXEN 500 MG TABLET: 500 | 15 days supply | Qty: 30 | Fill #0
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
## 2016-10-12 ENCOUNTER — Ambulatory Visit (INDEPENDENT_AMBULATORY_CARE_PROVIDER_SITE_OTHER): Payer: PRIVATE HEALTH INSURANCE | Admitting: Family Medicine

## 2016-10-12 ENCOUNTER — Encounter: Payer: Self-pay | Admitting: Family Medicine

## 2016-10-12 VITALS — BP 104/66 | HR 56 | Temp 98.0°F | Ht 69.0 in | Wt 180.0 lb

## 2016-10-12 DIAGNOSIS — M25511 Pain in right shoulder: Secondary | ICD-10-CM

## 2016-10-12 DIAGNOSIS — M25512 Pain in left shoulder: Secondary | ICD-10-CM | POA: Diagnosis not present

## 2016-10-12 DIAGNOSIS — E7849 Other hyperlipidemia: Secondary | ICD-10-CM

## 2016-10-12 DIAGNOSIS — F17200 Nicotine dependence, unspecified, uncomplicated: Secondary | ICD-10-CM

## 2016-10-12 DIAGNOSIS — Z0000 Encounter for general adult medical examination without abnormal findings: Secondary | ICD-10-CM | POA: Diagnosis not present

## 2016-10-12 DIAGNOSIS — J302 Other seasonal allergic rhinitis: Secondary | ICD-10-CM | POA: Diagnosis not present

## 2016-10-12 DIAGNOSIS — L659 Nonscarring hair loss, unspecified: Secondary | ICD-10-CM | POA: Diagnosis not present

## 2016-10-12 DIAGNOSIS — E784 Other hyperlipidemia: Secondary | ICD-10-CM | POA: Diagnosis not present

## 2016-10-12 DIAGNOSIS — R748 Abnormal levels of other serum enzymes: Secondary | ICD-10-CM | POA: Diagnosis not present

## 2016-10-12 LAB — CBC
HCT: 45.9 % (ref 39.0–52.0)
Hemoglobin: 15.1 g/dL (ref 13.0–17.0)
MCHC: 33 g/dL (ref 30.0–36.0)
MCV: 85.4 fl (ref 78.0–100.0)
Platelets: 217 10*3/uL (ref 150.0–400.0)
RBC: 5.37 Mil/uL (ref 4.22–5.81)
RDW: 13.3 % (ref 11.5–15.5)
WBC: 6.3 10*3/uL (ref 4.0–10.5)

## 2016-10-12 LAB — TSH: TSH: 2.07 u[IU]/mL (ref 0.35–4.50)

## 2016-10-12 LAB — COMPREHENSIVE METABOLIC PANEL
ALT: 29 U/L (ref 0–53)
AST: 23 U/L (ref 0–37)
Albumin: 4.4 g/dL (ref 3.5–5.2)
Alkaline Phosphatase: 65 U/L (ref 39–117)
BUN: 20 mg/dL (ref 6–23)
CO2: 29 mEq/L (ref 19–32)
Calcium: 9.3 mg/dL (ref 8.4–10.5)
Chloride: 104 mEq/L (ref 96–112)
Creatinine, Ser: 1.03 mg/dL (ref 0.40–1.50)
GFR: 89.63 mL/min (ref >60.00)
Glucose, Bld: 92 mg/dL (ref 70–99)
Potassium: 4.1 mEq/L (ref 3.5–5.1)
Sodium: 139 mEq/L (ref 135–145)
Total Bilirubin: 0.6 mg/dL (ref 0.2–1.2)
Total Protein: 6.9 g/dL (ref 6.0–8.3)

## 2016-10-12 LAB — LIPID PANEL
Cholesterol: 129 mg/dL (ref 0–200)
HDL: 45.8 mg/dL (ref >39.00)
LDL Cholesterol: 72 mg/dL (ref 0–99)
NonHDL: 83.28
Total CHOL/HDL Ratio: 3
Triglycerides: 56 mg/dL (ref 0.0–149.0)
VLDL: 11.2 mg/dL (ref 0.0–40.0)

## 2016-10-12 LAB — TESTOSTERONE: Testosterone: 367.95 ng/dL (ref 300.00–890.00)

## 2016-10-12 LAB — T3, FREE: T3, Free: 3.8 pg/mL (ref 2.3–4.2)

## 2016-10-12 NOTE — Progress Notes (Signed)
Robert Richmond is a 31 y.o. male is here to [facility].   History of Present Illness:   Britt Bottom CMA acting as scribe for Dr. Earlene Plater.  HPI Patient is coming in today to establish care. He is having pain in both shoulders that radiates down the arms. He has been told that these symptoms come from playing tennis and lifting weights. He is wanting a referral to sports medicine for this. He is currently in AA for alcohol abuse. He has some anxiety, but does not want medication at this time. He has some congestion and sinus problems that have been going on for two days now. Denies any sore throat or ear pain at this time.   1. Routine physical examination. See separate note.   2. Bilateral shoulder pain, unspecified chronicity. Left worse than right. Pain with serving during tennis. No trauma. Treats with NSAIDs. Would like to see SM.    3. Elevated liver enzymes. Hx of. Stopped drinking ETOH. Needs recheck.    4. Hair loss. Male patterned. Interested in treatments, including Finasteride.    5. Hyperlipidemia. Hx of. Due for recheck. Has been watching diet and exercising.     6. Seasonal allergic rhinitis. Worsening recently.     7. Smoker. Daily. Not ready to quit.     There are no preventive care reminders to display for this patient.  PMHx, SurgHx, SocialHx, Medications, and Allergies were reviewed in the Visit Navigator and updated as appropriate.   Past Medical History:  Diagnosis Date  . Anxiety    Social History  Substance Use Topics  . Smoking status: Current Every Day Smoker    Packs/day: 0.30    Types: Cigarettes  . Smokeless tobacco: Never Used     Comment: down to 4-6 cigarettes/day; started smoking age 84  . Alcohol use 0.0 oz/week     Comment: 2-3 days a week:4-5 drinks each time   Current Medications and Allergies:   .  dexlansoprazole (DEXILANT) 60 MG capsule, Take 1 capsule (60 mg total) by mouth daily., Disp: 15 capsule, Rfl: 0 .  ibuprofen  (ADVIL,MOTRIN) 200 MG tablet, Take 600 mg by mouth as needed., Disp: , Rfl:  .  methocarbamol (ROBAXIN) 500 MG tablet, Take 1 tablet (500 mg total) by mouth 2 (two) times daily., Disp: 20 tablet, Rfl: 0 .  naproxen (NAPROSYN) 500 MG tablet, Take 1 tablet (500 mg total) by mouth 2 (two) times daily., Disp: 30 tablet, Rfl: 0 .  PARoxetine (PAXIL) 20 MG tablet, One daily, Disp: 30 tablet, Rfl: 0 .  ranitidine (ZANTAC) 150 MG tablet, Take 150 mg by mouth daily., Disp: , Rfl:  .  simethicone (MYLICON) 80 MG chewable tablet, Chew 80 mg by mouth every 6 (six) hours as needed for flatulence., Disp: , Rfl:   No Known Allergies   Review of Systems:   Review of Systems  Constitutional: Negative for chills, fever and malaise/fatigue.  HENT: Positive for congestion and sinus pain. Negative for ear pain and sore throat.        Symptoms for 2 days.  Eyes: Negative for blurred vision and double vision.  Respiratory: Negative for cough, shortness of breath and wheezing.   Cardiovascular: Positive for palpitations. Negative for chest pain and leg swelling.       Yesterday was last episode.   Gastrointestinal: Negative for abdominal pain, constipation, diarrhea, nausea and vomiting.  Genitourinary: Negative for dysuria.  Musculoskeletal: Positive for joint pain. Negative for back pain and neck  pain.       Both shoulders from playing tennis and lifting weights.   Skin: Negative for itching and rash.  Neurological: Positive for headaches. Negative for dizziness.  Psychiatric/Behavioral: Negative for depression, hallucinations and memory loss. The patient is nervous/anxious.     Vitals:   Vitals:   10/12/16 0835  BP: 104/66  Pulse: (!) 56  Temp: 98 F (36.7 C)   Physical Exam:   Physical Exam  Constitutional: He is oriented to person, place, and time. He appears well-developed and well-nourished. No distress.  HENT:  Head: Normocephalic and atraumatic.  Right Ear: External ear normal.  Left  Ear: External ear normal.  Nose: Nose normal.  Mouth/Throat: Oropharynx is clear and moist.  Eyes: Conjunctivae and EOM are normal. Pupils are equal, round, and reactive to light.  Neck: Normal range of motion. Neck supple.  Cardiovascular: Normal rate, regular rhythm, normal heart sounds and intact distal pulses.   Pulmonary/Chest: Effort normal and breath sounds normal.  Abdominal: Soft. Bowel sounds are normal.  Musculoskeletal: Normal range of motion.  Neurological: He is alert and oriented to person, place, and time.  Skin: Skin is warm and dry.  Psychiatric: He has a normal mood and affect. His behavior is normal. Judgment and thought content normal.  Nursing note and vitals reviewed.   Assessment and Plan:   Trent was seen today for establish care.  Diagnoses and all orders for this visit:  Routine physical examination Comments: Split Billing due to several issues addressed today. See separate note for CPE. Orders: -     CBC -     Comprehensive metabolic panel -     TSH -     T3, free -     Testosterone -     Lipid panel  Bilateral shoulder pain, unspecified chronicity Comments: Patient would like to play tennis again. To Sports Medicine. Orders: -     Ambulatory referral to Sports Medicine  Elevated liver enzymes Comments: Hx of. No longer drinking ETOH. Recheck. Labs pending.  Orders: -     Comprehensive metabolic panel  Hair loss Comments: Male patterned. Labs pending. To Dermatology. Orders: -     TSH -     T3, free -     Testosterone -     Ambulatory referral to Dermatology  Other hyperlipidemia Comments: Hx of. Labs pending.  Orders: -     CBC -     Comprehensive metabolic panel  Seasonal allergic rhinitis, unspecified trigger Comments: I recommended OTC Zyrtec and Flonase. Red flags reviewed for sinusitis.  Orders: -     CBC  Smoker Comments: I advised patient to quit smoking, and offered support. [place] QUITLINE: 1-800-QUIT-NOW  [phone]).     . Reviewed expectations re: course of current medical issues. . Discussed self-management of symptoms. . Outlined signs and symptoms indicating need for more acute intervention. . Patient verbalized understanding and all questions were answered. . See orders for this visit as documented in the electronic medical record. . Patient received an After Visit Summary.  Records requested if needed. I spent 45 minutes with this patient, greater than 50% was face-to-face time counseling regarding the above diagnoses.  CMA served as Neurosurgeon during this visit. History, Physical, and Plan performed by medical provider. Documentation and orders reviewed and attested to. Helane Rima, D.O.  Helane Rima, D.OCorinda Gub[place], [facility] 10/12/2016  Future Appointments Date Time Provider Department Center  10/26/2016 8:30 AM Andrena Mews, DO [facility] None  04/19/2017 8:15 AM Helane RimaWallace, Parnika Tweten, DO [facility] None

## 2016-10-13 DIAGNOSIS — J302 Other seasonal allergic rhinitis: Secondary | ICD-10-CM | POA: Insufficient documentation

## 2016-10-13 NOTE — Progress Notes (Signed)
Subjective:    Robert Richmond is a 31 y.o. male and is here for a comprehensive physical exam.  HPI  There are no preventive care reminders to display for this patient.  PMHx, SurgHx, SocialHx, Medications, and Allergies were reviewed in the Visit Navigator and updated as appropriate.   Past Medical History:  Diagnosis Date  . Anxiety   . Seasonal allergies   . Substance abuse   No past surgical history on file.  Family History  Problem Relation Age of Onset  . Hypertension Mother   . Hyperlipidemia Mother   . Transient ischemic attack Father   . Hyperlipidemia Father   . Heart disease Maternal Grandfather   . Cancer Paternal Grandmother     Social History  Substance Use Topics  . Smoking status: Former Smoker    Packs/day: 0.30    Types: Cigarettes  . Smokeless tobacco: Never Used     Comment: down to 4-6 cigarettes/day; started smoking age 14  . Alcohol use No    Review of Systems:   ROS: SEE OTHER NOTE. OTHERWISE, NEGATIVE.  Objective:   Vitals:   10/12/16 0835  BP: 104/66  Pulse: (!) 56  Temp: 98 F (36.7 C)   Body mass index is 26.58 kg/m.  General Appearance:  Alert, cooperative, no distress, appears stated age  Head:  Normocephalic, without obvious abnormality, atraumatic  Eyes:  PERRL, conjunctiva/corneas clear, EOM's intact, fundi benign, both eyes       Ears:  Normal TM's and external ear canals, both ears  Nose: Nares normal, septum midline, mucosa normal, no drainage    or sinus tenderness  Throat: Lips, mucosa, and tongue normal; teeth and gums normal  Neck: Supple, symmetrical, trachea midline, no adenopathy; thyroid:  No enlargement/tenderness/nodules; no carotit bruit or JVD  Back:   Symmetric, no curvature, ROM normal, no CVA tenderness  Lungs:   Clear to auscultation bilaterally, respirations unlabored  Chest wall:  No tenderness or deformity  Heart:  Regular rate and rhythm, S1 and S2 normal, no murmur, rub   or gallop  Abdomen:    Soft, non-tender, bowel sounds active all four quadrants, no masses, no organomegaly  Extremities: Extremities normal, atraumatic, no cyanosis or edema  Prostate: Not done.   Skin: Skin color, texture, turgor normal, no rashes or lesions  Lymph nodes: Cervical, supraclavicular, and axillary nodes normal  Neurologic: CNII-XII grossly intact. Normal strength, sensation and reflexes throughout    Assessment/Plan:   Anush was seen today for establish care.  Diagnoses and all orders for this visit:  Routine physical examination Comments: Split Billing due to several issues addressed today. See separate note for ACUTE ISSUES. Orders: -     CBC -     Comprehensive metabolic panel -     TSH -     T3, free -     Testosterone -     Lipid panel  Bilateral shoulder pain, unspecified chronicity Comments: Patient would like to play tennis again. To Sports Medicine. Orders: -     Ambulatory referral to Sports Medicine  Elevated liver enzymes Comments: Hx of. No longer drinking ETOH. Recheck. Labs pending.  Orders: -     Comprehensive metabolic panel  Hair loss Comments: Male patterned. Labs pending. To Dermatology. Orders: -     TSH -     T3, free -     Testosterone -     Ambulatory referral to Dermatology  Other hyperlipidemia Comments: Hx of. Labs pending.  Orders: -  CBC -     Comprehensive metabolic panel  Seasonal allergic rhinitis, unspecified trigger Comments: I recommended OTC Zyrtec and Flonase. Red flags reviewed for sinusitis.  Orders: -     CBC  Smoker Comments: I advised patient to quit smoking, and offered support. [place] QUITLINE: 1-800-QUIT-NOW [phone]([phone]).    Well Adult Exam: Labs ordered: Yes. Patient counseling was done. See below for items discussed. Discussed the patient's BMI.  The BMI BMI is in the acceptable range Follow up in one year.  Patient Counseling: [x]   Nutrition: Stressed importance of moderation in sodium/caffeine intake,  saturated fat and cholesterol, caloric balance, sufficient intake of fresh fruits, vegetables, and fiber.  [x]   Stressed the importance of regular exercise.   []   Substance Abuse: Discussed cessation/primary prevention of tobacco, alcohol, or other drug use; driving or other dangerous activities under the influence; availability of treatment for abuse.   [x]   Injury prevention: Discussed safety belts, safety helmets, smoke detector, smoking near bedding or upholstery.   []   Sexuality: Discussed sexually transmitted diseases, partner selection, use of condoms, avoidance of unintended pregnancy  and contraceptive alternatives.   [x]   Dental health: Discussed importance of regular tooth brushing, flossing, and dental visits.  [x]   Health maintenance and immunizations reviewed. Please refer to Health maintenance section.    Helane RimaErica Vito Beg, DO [place] [facility]

## 2016-10-26 ENCOUNTER — Ambulatory Visit: Payer: PRIVATE HEALTH INSURANCE | Admitting: Sports Medicine

## 2016-11-02 ENCOUNTER — Ambulatory Visit (INDEPENDENT_AMBULATORY_CARE_PROVIDER_SITE_OTHER): Payer: PRIVATE HEALTH INSURANCE

## 2016-11-02 ENCOUNTER — Ambulatory Visit (INDEPENDENT_AMBULATORY_CARE_PROVIDER_SITE_OTHER): Payer: PRIVATE HEALTH INSURANCE | Admitting: Sports Medicine

## 2016-11-02 ENCOUNTER — Telehealth: Payer: Self-pay | Admitting: Family Medicine

## 2016-11-02 ENCOUNTER — Encounter: Payer: Self-pay | Admitting: Sports Medicine

## 2016-11-02 VITALS — BP 102/78 | HR 53 | Ht 69.0 in | Wt 178.4 lb

## 2016-11-02 DIAGNOSIS — M25512 Pain in left shoulder: Secondary | ICD-10-CM | POA: Diagnosis not present

## 2016-11-02 DIAGNOSIS — G2589 Other specified extrapyramidal and movement disorders: Secondary | ICD-10-CM | POA: Diagnosis not present

## 2016-11-02 DIAGNOSIS — M25511 Pain in right shoulder: Secondary | ICD-10-CM | POA: Diagnosis not present

## 2016-11-02 DIAGNOSIS — M5412 Radiculopathy, cervical region: Secondary | ICD-10-CM | POA: Diagnosis not present

## 2016-11-02 MED ORDER — METHYLPREDNISOLONE 4 MG PO TBPK: ORAL_TABLET | ORAL | 0 refills | Status: DC

## 2016-11-02 MED ORDER — GABAPENTIN 300 MG PO CAPS: ORAL_CAPSULE | ORAL | 1 refills | Status: DC

## 2016-11-02 NOTE — Progress Notes (Signed)
OFFICE VISIT NOTE Robert Richmond. [facility] at [facility] [phone]  Robert Richmond - 31 y.o. male MRN [id number]  Date of birth: 1986/05/13  Visit Date: 11/02/2016  PCP: Helane Rima, DO   Referred by: Helane Rima, DO  Orlie Dakin, CMA acting as scribe for Dr. Berline Chough.  SUBJECTIVE:   Chief Complaint  Patient presents with  . pain in right shoulder   HPI: As below and per problem based documentation when appropriate.  Pt presents today with complaints of bilateral shoulder pain. Sx started at age 14 and are off an on.  Pt plays tennis, he has chronic tendonitis. Pt is left handed and he feels like when he has been working out he is over compensating with his right shoulder. His pain radiates, anterior and posterior. When he play tennis the pain in his left shoulder radiates from shoulder to elbow. He has been dx with bursitis in the past  The pain is described as aching then throbbing depending on how much he over does it and is rated as 8/10 when at its worst. Pt reports that he has quite matches before d/t the pain and reports that he typically has a high pain tolerance.   Worsened with serving while playing tennis, when doing shoulder exercises.  Improves with rest, stretching with bands, heat Therapies tried include Ibuprofen 1000 mg before matches.   Other associated symptoms include: pain radiates into traps and uppers arm.   Pt denies fever, chills, night sweats.     Review of Systems  Constitutional: Negative for chills and fever.  HENT: Negative.   Eyes: Negative.   Respiratory: Negative for shortness of breath and wheezing.   Cardiovascular: Negative for chest pain, palpitations and leg swelling.  Gastrointestinal: Negative.   Genitourinary: Negative.   Musculoskeletal: Positive for back pain, joint pain and myalgias. Negative for falls.  Skin: Negative.   Neurological: Positive for tingling. Negative for  dizziness, weakness and headaches.  Endo/Heme/Allergies: Does not bruise/bleed easily.    Otherwise per HPI.  HISTORY & PERTINENT PRIOR DATA:  No specialty comments available. He reports that he has quit smoking. His smoking use included Cigarettes. He smoked 0.30 packs per day. He has never used smokeless tobacco. No results for input(s): HGBA1C, LABURIC in the last 8760 hours. Medications & Allergies reviewed per EMR Patient Active Problem List   Diagnosis Date Noted  . Bilateral shoulder pain 11/02/2016  . Scapular dyskinesis 11/02/2016  . Seasonal allergies   . Elevated liver enzymes 05/28/2014  . Generalized anxiety disorder 05/28/2014  . Panic attack 05/28/2014  . Gastroesophageal reflux disease without esophagitis 05/28/2014  . Tobacco abuse 05/28/2014   Past Medical History:  Diagnosis Date  . Anxiety   . Seasonal allergies   . Substance abuse    Family History  Problem Relation Age of Onset  . Hypertension Mother   . Hyperlipidemia Mother   . Transient ischemic attack Father   . Hyperlipidemia Father   . Heart disease Maternal Grandfather   . Cancer Paternal Grandmother    No past surgical history on file. Social History   Occupational History  . tennis coach, Secondary school teacher of public speaking at [employer]    Social History Main Topics  . Smoking status: Former Smoker    Packs/day: 0.30    Types: Cigarettes  . Smokeless tobacco: Never Used     Comment: down to 4-6 cigarettes/day; started smoking age 23  . Alcohol use No  .  Drug use: No  . Sexual activity: Yes    Partners: Male    OBJECTIVE:  VS:  HT:5\' 9"  (175.3 cm)   WT:178 lb 6.4 oz (80.9 kg)  BMI:26.4    BP:102/78  HR:(!) 53bpm  TEMP: ( )  RESP:97 % EXAM: Findings:  WDWN, NAD, Non-toxic appearing Alert & appropriately interactive Not depressed or anxious appearing No increased work of breathing. Pupils are equal. EOM intact without nystagmus No clubbing or cyanosis of the extremities  appreciated No significant rashes/lesions/ulcerations overlying the examined area. Radial pulses 2+/4.  No significant generalized UE edema. Sensation intact to light touch in upper extremities.  Neck and shoulders: Bilateral shoulder strength is 5/5 in all motions. Neck range of motion is slightly limited in forward flexion. Shoulder range of motion arc at 90 is 180 on the right and 120 on the left going to 90 of external rotation. Minimal crepitation with axial load circumduction bilaterally with no significant pain with this. Minimal pain with O'Brien's testing on the left otherwise normal on the right.  Patient x-rays are essentially normal with slight rotation of C6 but this is minimal.  Some mild change between C6 and 7.    Dg Cervical Spine 2 Or 3 Views  Result Date: 11/02/2016 CLINICAL DATA:  Chronic neck and bilateral shoulder pain without known injury. EXAM: CERVICAL SPINE - 2-3 VIEW COMPARISON:  None. FINDINGS: There is no evidence of cervical spine fracture or prevertebral soft tissue swelling. Alignment is normal. No other significant bone abnormalities are identified. IMPRESSION: Negative cervical spine radiographs. Electronically Signed   By: Lupita RaiderJames  Green Jr, M.D.   On: 11/02/2016 13:24   ASSESSMENT & PLAN:   Problem List Items Addressed This Visit    Bilateral shoulder pain - Primary    Once again suspect underlying neurogenic radiculitis as likely cause of his bilateral shoulder and left greater than right arm pain. Consider referral to formal physical therapy.  Work on thoracic mobilization for improved to serve technique.      Relevant Orders   DG Cervical Spine 2 or 3 views (Completed)   Scapular dyskinesis    Marked scapular dyskinesis of the left shoulder.  Suspect an underlying nerve root irritation that may be causing some guarding of the shoulder.  He does have glenohumeral rotational limitations and therapeutic exercises have been provided to work on this.   Work and focus on scapular stabilization and posterior chain exercises as well as improvement of his external rotation arc.  +++++++++++++++++++++++++++++++++++++++++++++++++++++++++++++++ PROCEDURE NOTE: THERAPEUTIC EXERCISES (97110) 15 minutes spent for Therapeutic exercises as stated in above notes.  This included exercises focusing on stretching, strengthening, with significant focus on eccentric aspects.   Proper technique shown and discussed handout in great detail with ATC.  All questions were discussed and answered.        Relevant Medications   methylPREDNISolone (MEDROL DOSEPAK) 4 MG TBPK tablet   gabapentin (NEURONTIN) 300 MG capsule    Other Visit Diagnoses    Radiculitis involving upper extremity       Relevant Medications   methylPREDNISolone (MEDROL DOSEPAK) 4 MG TBPK tablet   gabapentin (NEURONTIN) 300 MG capsule      Follow-up: Return in about 8 weeks (around 12/28/2016).   CMA/ATC served as Neurosurgeonscribe during this visit. History, Physical, and Plan performed by medical provider. Documentation and orders reviewed and attested to.      Gaspar BiddingMichael Coulson Wehner, DO    [facility]

## 2016-11-02 NOTE — Telephone Encounter (Signed)
error 

## 2016-11-02 NOTE — Patient Instructions (Signed)
Please perform the exercise program that Fayrene FearingJames has prepared for you and gone over in detail on a daily basis.  In addition to the handout you were provided you can access your program through: [email or web address]   Your unique program code is: [email or web address]

## 2016-11-06 NOTE — Assessment & Plan Note (Signed)
Marked scapular dyskinesis of the left shoulder.  Suspect an underlying nerve root irritation that may be causing some guarding of the shoulder.  He does have glenohumeral rotational limitations and therapeutic exercises have been provided to work on this.  Work and focus on scapular stabilization and posterior chain exercises as well as improvement of his external rotation arc.  +++++++++++++++++++++++++++++++++++++++++++++++++++++++++++++++ PROCEDURE NOTE: THERAPEUTIC EXERCISES (97110) 15 minutes spent for Therapeutic exercises as stated in above notes.  This included exercises focusing on stretching, strengthening, with significant focus on eccentric aspects.   Proper technique shown and discussed handout in great detail with ATC.  All questions were discussed and answered.

## 2016-11-06 NOTE — Assessment & Plan Note (Signed)
Once again suspect underlying neurogenic radiculitis as likely cause of his bilateral shoulder and left greater than right arm pain. Consider referral to formal physical therapy.  Work on thoracic mobilization for improved to serve technique.

## 2016-11-12 ENCOUNTER — Telehealth: Payer: Self-pay | Admitting: Sports Medicine

## 2016-11-12 NOTE — Telephone Encounter (Signed)
Forwarding to Dr. Rigby.  

## 2016-11-12 NOTE — Telephone Encounter (Signed)
Patient called to advise that the rx:  gabapentin (NEURONTIN) 300 MG capsule is making patient depressed; stopped taking it.   methylPREDNISolone (MEDROL DOSEPAK) 4 MG TBPK tablet is not working and he is still having difficulty  Please call patient today to advise on what he should do next. Patient is awaiting the call. Okay to leave a detailed message on phone.

## 2016-11-14 NOTE — Telephone Encounter (Signed)
Okay to discontinue gabapentin. He is having persistent ongoing symptoms we can consider an MRI of his neck since this is been going on for greater than 6 weeks, he has tried physical therapy exercises, and has tried corticosteroids and gabapentinoids.  Please place referral for MRI of the cervical spine if he is interested otherwise we can plan to refer him for more formal physical therapy.

## 2016-11-14 NOTE — Telephone Encounter (Signed)
Called and left VM, per pt request, advising. I requested pt call back if he would like MRI or referral to PT.

## 2017-01-18 ENCOUNTER — Ambulatory Visit: Payer: PRIVATE HEALTH INSURANCE | Admitting: Sports Medicine

## 2017-02-15 ENCOUNTER — Ambulatory Visit: Payer: PRIVATE HEALTH INSURANCE | Admitting: Sports Medicine

## 2017-03-05 ENCOUNTER — Ambulatory Visit (INDEPENDENT_AMBULATORY_CARE_PROVIDER_SITE_OTHER): Payer: PRIVATE HEALTH INSURANCE | Admitting: Family Medicine

## 2017-03-05 ENCOUNTER — Encounter: Payer: Self-pay | Admitting: Family Medicine

## 2017-03-05 VITALS — BP 100/80 | HR 65 | Temp 98.6°F | Ht 69.0 in | Wt 177.4 lb

## 2017-03-05 DIAGNOSIS — J029 Acute pharyngitis, unspecified: Secondary | ICD-10-CM | POA: Diagnosis not present

## 2017-03-05 LAB — POCT RAPID STREP A (OFFICE): RAPID STREP A SCREEN: NEGATIVE

## 2017-03-05 MED ORDER — IPRATROPIUM BROMIDE 0.06 % NA SOLN: 2.0000 | Freq: Four times a day (QID) | NASAL | 12 refills | Status: DC

## 2017-03-05 NOTE — Progress Notes (Signed)
   Subjective:  Robert Richmond is a 30 y.o. male who presents today with a chief complaint of sore throat.   HPI:  Sore Throat, Acute Problem Symptoms started a day ago. Associated with dysphasia, chills, and headaches. No medications tried. No fevers. No nausea or vomiting. No sick contacts.  Was at a tournament at [place]. Symptoms have gotten worse over the past day. No cough. Some ear pressure, but no pain.   ROS: Per HPI  PMH: Smoking history reviewed. Former smoker.   Objective:  Physical Exam: BP 100/80   Pulse 65   Temp 98.6 F (37 C) (Oral)   Ht  (1.753 m)   Wt 177 lb 6.4 oz (80.5 kg)   SpO2 98%   BMI 26.20 kg/m   Gen: NAD, resting comfortably HEENT: TMs with clear effusions bilaterally. OP erythematous. Nasal mucosa boggy bilaterally. Shotty LAD.  CV: RRR with no murmurs appreciated Pulm: NWOB, CTAB with no crackles, wheezes, or rhonchi  Results for orders placed or performed in visit on 03/05/17 (from the past 72 hour(s))  POCT rapid strep A     Status: None   Collection Time: 03/05/17 10:41 AM  Result Value Ref Range   Rapid Strep A Screen Negative Negative   Assessment/Plan:  Sore Throat Centor score of 2, though patient does have a negative rapid strep screen here. Will send for culture. In the interim, will proceed with supportive care. Will send in Rx for atrovent to help alleviate his nasal and sinus congestion. Recommended OTC ibuprofen as needed for pain. Encouraged good PO hydration. Strict return precautions reviewed. Follow up as needed.   Katina Degree. Jimmey Ralph, MD 03/05/2017 10:55 AM

## 2017-03-05 NOTE — Patient Instructions (Signed)
Start the atrovent.  We will check a culture.  Take ibuprofen as needed.   Come back if not improving in 5-7 days.  Take care,  Dr Jimmey Ralph

## 2017-03-07 LAB — CULTURE, GROUP A STREP
MICRO NUMBER:: 81060881
SPECIMEN QUALITY: ADEQUATE

## 2017-04-19 ENCOUNTER — Ambulatory Visit: Payer: BLUE CROSS/BLUE SHIELD | Admitting: Family Medicine

## 2017-06-12 DIAGNOSIS — R079 Chest pain, unspecified: Secondary | ICD-10-CM | POA: Diagnosis not present

## 2017-06-13 ENCOUNTER — Encounter: Payer: Self-pay | Admitting: Family Medicine

## 2017-06-13 ENCOUNTER — Ambulatory Visit: Payer: PRIVATE HEALTH INSURANCE | Admitting: Family Medicine

## 2017-06-13 DIAGNOSIS — F410 Panic disorder [episodic paroxysmal anxiety] without agoraphobia: Secondary | ICD-10-CM | POA: Diagnosis not present

## 2017-06-13 DIAGNOSIS — F411 Generalized anxiety disorder: Secondary | ICD-10-CM

## 2017-06-13 MED ORDER — HYDROXYZINE HCL 50 MG PO TABS: 50.0000 mg | ORAL_TABLET | Freq: Three times a day (TID) | ORAL | 0 refills | Status: DC | PRN

## 2017-06-13 NOTE — Patient Instructions (Signed)
Use hydroxyzine as needed.  If you change your mind about starting a daily medication, please let me know.  Please also let me know if you change your mind about seeing a behavioral specialist.  Come back to see either myself or Dr. Earlene PlaterWallace in 4-6 weeks.  There are several other options if hydroxyzine is not working well for you.  Take care, Dr. Jimmey RalphParker

## 2017-06-13 NOTE — Assessment & Plan Note (Signed)
See GAD A/P

## 2017-06-13 NOTE — Assessment & Plan Note (Signed)
Patient deferred starting SSRI or other daily medication.  Also deferred referral to psychotherapy.  Wishes to be on as needed medications only.  Discussed treatment options for as needed medications.  We will start hydroxyzine 50 mg 3 times daily as needed for anxiety.  Recommended against starting with Xanax, especially given his history of substance abuse.  He will follow-up with me or his PCP in 4-6 weeks.  Would continue encouraging patient to start SSRI and/or referral to psychotherapy.

## 2017-06-13 NOTE — Progress Notes (Signed)
    Subjective:  Robert FinnerBrian Zellner is a 32 y.o. male who presents today with a chief complaint of anxiety disorder with panic attacks.   HPI:  Anxiety Disorder with Panic Attack, New Problem Several year history. Worsened over the past several weeks.  He has had 3 panic attacks in the past 5 days.  He has been on several medications for this in the past including several SSRIs including Paxil and Lexapro.  Paxil helped with his symptoms however Lexapro did not.  He is also been on Xanax for this in the past which helped control his panic attacks.  During his most recent episode he was seen by paramedic who advised him that he may need to start taking Xanax again.  He has had several significant life stressors recently including increased stress at work and increased stress at home-he recently bought a house and is found several things that need to be fixed.  No SI or HI.   ROS: Per HPI  PMH: he reports that he has quit smoking. His smoking use included cigarettes. He smoked 0.30 packs per day. he has never used smokeless tobacco. He reports that he does not drink alcohol or use drugs.  Has a history of substance abuse.   Objective:  Physical Exam: BP 118/68 (BP Location: Left Arm, Patient Position: Sitting, Cuff Size: Normal)   Pulse 64   Temp 98.7 F (37.1 C) (Oral)   Ht 5\' 9"  (1.753 m)   Wt 169 lb 12.8 oz (77 kg)   SpO2 96%   BMI 25.08 kg/m   Gen: NAD, resting comfortably Neuro: Grossly normal, moves all extremities Psych: Normal affect and thought content  Assessment/Plan:  Generalized anxiety disorder Patient deferred starting SSRI or other daily medication.  Also deferred referral to psychotherapy.  Wishes to be on as needed medications only.  Discussed treatment options for as needed medications.  We will start hydroxyzine 50 mg 3 times daily as needed for anxiety.  Recommended against starting with Xanax, especially given his history of substance abuse.  He will follow-up with me or  his PCP in 4-6 weeks.  Would continue encouraging patient to start SSRI and/or referral to psychotherapy.  Panic attack See GAD A/P.   Katina Degreealeb M. Jimmey RalphParker, MD 06/13/2017 3:42 PM

## 2017-07-02 ENCOUNTER — Telehealth: Payer: Self-pay | Admitting: Family Medicine

## 2017-07-02 NOTE — Telephone Encounter (Signed)
You can read my office visit note. It is clear that we discussed multiple issues at his visit. I charged for a CPE and spit visit code. This was appropriate. Also, the coders should have already gone through and approved it if he was charged that amount.   FYI - I give you permission to check the note in the future. It should generally be obvious. I'm trying to avoid cluttering my inbox.

## 2017-07-02 NOTE — Telephone Encounter (Signed)
Patient contacted the office inquiring about the statements he received since moving for the dates of service 10-12-16 with Dr. Earlene PlaterWallace and 11-02-16 with Dr. Berline Choughigby. Patient states that the insurance was not on either statement. I checked the chart and the insurance was on file at the time of both visits.   I explained to the patient that I would e-mail [facility] [employer] to ask them to resubmit the claims to the insurance on file at that time, [facility].   Patient also would like clarification on why he had as many charges as he did for the 10-12-16 visit with Dr. Earlene PlaterWallace due to getting a preventative visit done. I explained to the patient that the first appointment is to establish care and if there is time the provider does a physical if nothing else is discussed however, I would reach out to Dr. Earlene PlaterWallace to advise on how the visit was billed. Dr. Earlene PlaterWallace please clarify so I can update the patient once I hear back from you and [employer].

## 2017-08-14 ENCOUNTER — Emergency Department
Admission: EM | Admit: 2017-08-14 | Discharge: 2017-08-15 | Disposition: A | Discharge status: Home / Self Care | Payer: 59 | Attending: Emergency Medicine | Admitting: Emergency Medicine

## 2017-08-14 ENCOUNTER — Emergency Department: Payer: 59

## 2017-08-14 ENCOUNTER — Other Ambulatory Visit: Payer: Self-pay

## 2017-08-14 ENCOUNTER — Encounter: Payer: Self-pay | Admitting: Emergency Medicine

## 2017-08-14 DIAGNOSIS — Z79899 Other long term (current) drug therapy: Secondary | ICD-10-CM | POA: Diagnosis not present

## 2017-08-14 DIAGNOSIS — F419 Anxiety disorder, unspecified: Secondary | ICD-10-CM | POA: Diagnosis not present

## 2017-08-14 DIAGNOSIS — R079 Chest pain, unspecified: Secondary | ICD-10-CM | POA: Diagnosis not present

## 2017-08-14 DIAGNOSIS — R002 Palpitations: Secondary | ICD-10-CM | POA: Insufficient documentation

## 2017-08-14 DIAGNOSIS — F17210 Nicotine dependence, cigarettes, uncomplicated: Secondary | ICD-10-CM | POA: Insufficient documentation

## 2017-08-14 LAB — CBC
HEMATOCRIT: 45.7 % (ref 40.0–52.0)
HEMOGLOBIN: 15.1 g/dL (ref 13.0–18.0)
MCH: 27.9 pg (ref 26.0–34.0)
MCHC: 33 g/dL (ref 32.0–36.0)
MCV: 84.4 fL (ref 80.0–100.0)
Platelets: 241 10*3/uL (ref 150–440)
RBC: 5.41 MIL/uL (ref 4.40–5.90)
RDW: 13.3 % (ref 11.5–14.5)
WBC: 10.2 10*3/uL (ref 3.8–10.6)

## 2017-08-14 LAB — BASIC METABOLIC PANEL
ANION GAP: 9 (ref 5–15)
BUN: 21 mg/dL — ABNORMAL HIGH (ref 6–20)
CALCIUM: 9 mg/dL (ref 8.9–10.3)
CHLORIDE: 102 mmol/L (ref 101–111)
CO2: 27 mmol/L (ref 22–32)
Creatinine, Ser: 1.09 mg/dL (ref 0.61–1.24)
GFR calc Af Amer: >60 mL/min (ref >60)
GFR calc non Af Amer: >60 mL/min (ref >60)
GLUCOSE: 134 mg/dL — AB (ref 65–99)
POTASSIUM: 3.6 mmol/L (ref 3.5–5.1)
Sodium: 138 mmol/L (ref 135–145)

## 2017-08-14 LAB — URINE DRUG SCREEN, QUALITATIVE (ARMC ONLY)
Amphetamines, Ur Screen: NOT DETECTED
Barbiturates, Ur Screen: NOT DETECTED
Benzodiazepine, Ur Scrn: NOT DETECTED
CANNABINOID 50 NG, UR ~~LOC~~: NOT DETECTED
COCAINE METABOLITE, UR ~~LOC~~: NOT DETECTED
MDMA (ECSTASY) UR SCREEN: NOT DETECTED
Methadone Scn, Ur: NOT DETECTED
Opiate, Ur Screen: NOT DETECTED
PHENCYCLIDINE (PCP) UR S: NOT DETECTED
Tricyclic, Ur Screen: NOT DETECTED

## 2017-08-14 LAB — TROPONIN I: Troponin I: <0.03 ng/mL (ref <0.03)

## 2017-08-14 NOTE — ED Provider Notes (Signed)
[facility] Emergency Department Provider Note   ____________________________________________   First MD Initiated Contact with Patient 08/14/17 2332     (approximate)  I have reviewed the triage vital signs and the nursing notes.   HISTORY  Chief Complaint Palpitations    HPI Robert Richmond is a 32 y.o. male who presents to the ED from home with a chief complaint of palpitations.  Patient has a history of anxiety, former substance abuser, who was hosting a Public librarian.  He was using E cigarettes and smoking real cigarettes which she normally does not do.  In addition, patient drank an energy drink, multiple sodas and ate lots of sugar.  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  After winning the game, patient said he had a period where he felt like his heart was beating out of his chest.  Denies associated diaphoresis, shortness of breath, chest pain, nausea/vomiting, dizziness.  Patient denies recent fever, chills, cough, congestion, abdominal pain, dysuria, diarrhea.  Had a trip to [facility] 1.5 weeks ago.  Has not had calf pain, chest pain or shortness of breath since returning.  Denies recent trauma.  Denies hormone use.   Past Medical History:  Diagnosis Date  . Anxiety   . Seasonal allergies   . Substance abuse [facility])     Patient Active Problem List   Diagnosis Date Noted  . Bilateral shoulder pain 11/02/2016  . Scapular dyskinesis 11/02/2016  . Seasonal allergies   . Elevated liver enzymes 05/28/2014  . Generalized anxiety disorder 05/28/2014  . Panic attack 05/28/2014  . Gastroesophageal reflux disease without esophagitis 05/28/2014  . Tobacco abuse 05/28/2014    History reviewed. No pertinent surgical history.  Prior to Admission medications   Medication Sig Start Date End Date Taking? Authorizing Provider  hydrOXYzine (ATARAX/VISTARIL) 50 MG tablet Take 1 tablet (50 mg total) by  mouth 3 (three) times daily as needed for anxiety. 06/13/17   Ardith Dark, MD    Allergies Patient has no known allergies.  Family History  Problem Relation Age of Onset  . Hypertension Mother   . Hyperlipidemia Mother   . Transient ischemic attack Father   . Hyperlipidemia Father   . Heart disease Maternal Grandfather   . Cancer Paternal Grandmother     Social History Social History   Tobacco Use  . Smoking status: Former Smoker    Packs/day: 0.30    Types: Cigarettes  . Smokeless tobacco: Never Used  . Tobacco comment: down to 4-6 cigarettes/day; started smoking age 54  Substance Use Topics  . Alcohol use: No    Alcohol/week: 0.0 oz  . Drug use: No    Review of Systems  Constitutional: No fever/chills. Eyes: No visual changes. ENT: No sore throat. Cardiovascular: Positive for palpitations.  Denies chest pain. Respiratory: Denies shortness of breath. Gastrointestinal: No abdominal pain.  No nausea, no vomiting.  No diarrhea.  No constipation. Genitourinary: Negative for dysuria. Musculoskeletal: Negative for back pain. Skin: Negative for rash. Neurological: Negative for headaches, focal weakness or numbness. Psychiatric: Positive for anxiety.  ____________________________________________   PHYSICAL EXAM:  VITAL SIGNS: ED Triage Vitals [08/14/17 2243]  Enc Vitals Group     BP 139/75     Pulse Rate 76     Resp 18     Temp 97.9 F (36.6 C)     Temp Source Oral     SpO2 99 %  Weight      Height      Head Circumference      Peak Flow      Pain Score      Pain Loc      Pain Edu?      Excl. in GC?     Constitutional: Alert and oriented. Well appearing and in no acute distress. Eyes: Conjunctivae are normal. PERRL. EOMI. Head: Atraumatic. Nose: No congestion/rhinnorhea. Mouth/Throat: Mucous membranes are moist.  Oropharynx non-erythematous. Neck: No stridor.  No carotid bruits. Cardiovascular: Normal rate, regular rhythm. Grossly normal heart  sounds.  Good peripheral circulation. Respiratory: Normal respiratory effort.  No retractions. Lungs CTAB. Gastrointestinal: Soft and nontender. No distention. No abdominal bruits. No CVA tenderness. Musculoskeletal: No lower extremity tenderness nor edema.  No joint effusions. Neurologic:  Normal speech and language. No gross focal neurologic deficits are appreciated. No gait instability. Skin:  Skin is warm, dry and intact. No rash noted. Psychiatric: Mood and affect are slightly anxious. Speech and behavior are normal.  ____________________________________________   LABS (all labs ordered are listed, but only abnormal results are displayed)  Labs Reviewed  BASIC METABOLIC PANEL - Abnormal; Notable for the following components:      Result Value   Glucose, Bld 134 (*)    BUN 21 (*)    All other components within normal limits  CBC  TROPONIN I  URINE DRUG SCREEN, QUALITATIVE ([facility] ONLY)   ____________________________________________  EKG  ED ECG REPORT I, Shavontae Gibeault J, the attending physician, personally viewed and interpreted this ECG.   Date: 08/15/2017  EKG Time: 2243  Rate: 85  Rhythm: normal EKG, normal sinus rhythm  Axis: Normal  Intervals:none  ST&T Change: Nonspecific  ____________________________________________  RADIOLOGY  ED MD interpretation: No acute cardiopulmonary process  Official radiology report(s): Dg Chest 2 View  Result Date: 08/14/2017 CLINICAL DATA:  Heart pounding, chest pain EXAM: CHEST - 2 VIEW COMPARISON:  None. FINDINGS: The heart size and mediastinal contours are within normal limits. Both lungs are clear. The visualized skeletal structures are unremarkable. IMPRESSION: No active cardiopulmonary disease. Electronically Signed   By: Jasmine PangKim  Fujinaga M.D.   On: 08/14/2017 23:03    ____________________________________________   PROCEDURES  Procedure(s) performed: None  Procedures  Critical Care performed:  No  ____________________________________________   INITIAL IMPRESSION / ASSESSMENT AND PLAN / ED COURSE  As part of my medical decision making, I reviewed the following data within the electronic MEDICAL RECORD NUMBER History obtained from family, Nursing notes reviewed and incorporated, Labs reviewed, EKG interpreted, Old chart reviewed, Radiograph reviewed and Notes from prior ED visits   32 year old male who presents with palpitations after a night of nicotine, energy drinks and sugar. Differential diagnosis includes, but is not limited to, ACS, aortic dissection, pulmonary embolism, cardiac tamponade, pneumothorax, pneumonia, pericarditis, myocarditis, GI-related causes including esophagitis/gastritis, and musculoskeletal chest wall pain.    Patient has hydroxyzine at home but did not take prior to arrival.  All symptoms have resolved without intervention.  Currently denies palpitations, chest pain or shortness of breath.  Updated patient and girlfriend of all results.  Since he is a recovering substance abuser, he opts against benzodiazepines.  Will give a dose of hydroxyzine here and refer to cardiology for outpatient follow-up.  Strict return precautions given.  Patient and family member verbalize understanding and agree with plan of care.      ____________________________________________   FINAL CLINICAL IMPRESSION(S) / ED DIAGNOSES  Final diagnoses:  Palpitations  Anxiety  ED Discharge Orders    None       Note:  This document was prepared using Dragon voice recognition software and may include unintentional dictation errors.    Irean Hong, MD 08/15/17 [phone]

## 2017-08-14 NOTE — ED Triage Notes (Signed)
Patient ambulatory to triage with steady gait, without difficulty or distress noted; pt reports "I had a lot of sugar and nicotine tonight"; had onset heart pounding; denies CP; st hx of same with dx poss panic attack

## 2017-08-14 NOTE — ED Notes (Signed)
Pt to the er for rapid heart rate. Pt has anxiety medication hydroxyzine but did not take it tonight because he was afraid. 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 Pt also drank an energy drink, soda, and ate lots of sugar. Pt heart rate is WNL. Pt is a former user but is now clean and sober. Advised pt to not drink energy drinks, or use liquid nicotine if he cannot tolerate and to take his anxiety meds for his anxiety. Pt VSS.

## 2017-08-15 MED ORDER — HYDROXYZINE HCL 25 MG PO TABS: 50.0000 mg | ORAL_TABLET | Freq: Once | ORAL | Status: DC

## 2017-08-15 NOTE — ED Notes (Signed)
Family at bedside. 

## 2017-08-15 NOTE — ED Notes (Signed)
ED Provider at bedside. 

## 2017-08-15 NOTE — Discharge Instructions (Signed)
1.  You may take your Hydroxyzine as needed for anxiety. 2.  Return to the ER for recurrent or worsening symptoms, persistent vomiting, difficulty breathing or other concerns.

## 2017-08-23 DIAGNOSIS — R000 Tachycardia, unspecified: Secondary | ICD-10-CM | POA: Diagnosis not present

## 2017-08-23 DIAGNOSIS — R0602 Shortness of breath: Secondary | ICD-10-CM | POA: Diagnosis not present

## 2017-08-23 DIAGNOSIS — I208 Other forms of angina pectoris: Secondary | ICD-10-CM | POA: Diagnosis not present

## 2017-09-02 DIAGNOSIS — I208 Other forms of angina pectoris: Secondary | ICD-10-CM | POA: Diagnosis not present

## 2017-09-02 DIAGNOSIS — R0602 Shortness of breath: Secondary | ICD-10-CM | POA: Diagnosis not present

## 2017-11-07 DIAGNOSIS — R0602 Shortness of breath: Secondary | ICD-10-CM | POA: Diagnosis not present

## 2017-11-07 DIAGNOSIS — R000 Tachycardia, unspecified: Secondary | ICD-10-CM | POA: Diagnosis not present

## 2017-12-03 ENCOUNTER — Other Ambulatory Visit: Payer: Self-pay | Admitting: Internal Medicine

## 2017-12-03 DIAGNOSIS — J0100 Acute maxillary sinusitis, unspecified: Secondary | ICD-10-CM | POA: Insufficient documentation

## 2017-12-03 MED ORDER — METHYLPREDNISOLONE 4 MG PO TBPK: ORAL_TABLET | ORAL | 0 refills | Status: AC

## 2017-12-03 MED ORDER — AMOXICILLIN-POT CLAVULANATE 875-125 MG PO TABS: 1.0000 | ORAL_TABLET | Freq: Two times a day (BID) | ORAL | 0 refills | Status: AC

## 2017-12-20 ENCOUNTER — Ambulatory Visit (INDEPENDENT_AMBULATORY_CARE_PROVIDER_SITE_OTHER): Payer: 59 | Admitting: Internal Medicine

## 2017-12-20 ENCOUNTER — Encounter: Payer: Self-pay | Admitting: Internal Medicine

## 2017-12-20 VITALS — BP 110/70 | HR 82 | Temp 97.7°F | Resp 16 | Wt 171.0 lb

## 2017-12-20 DIAGNOSIS — J301 Allergic rhinitis due to pollen: Secondary | ICD-10-CM

## 2017-12-20 MED ORDER — MONTELUKAST SODIUM 10 MG PO TABS: 10.0000 mg | ORAL_TABLET | Freq: Every day | ORAL | 1 refills | Status: DC

## 2017-12-20 MED ORDER — METHYLPREDNISOLONE 4 MG PO TBPK: ORAL_TABLET | ORAL | 0 refills | Status: AC

## 2017-12-20 MED ORDER — FLUTICASONE PROPIONATE 50 MCG/ACT NA SUSP: 2.0000 | Freq: Every day | NASAL | 1 refills | Status: DC

## 2017-12-20 MED ORDER — LEVOCETIRIZINE DIHYDROCHLORIDE 5 MG PO TABS: 5.0000 mg | ORAL_TABLET | Freq: Every evening | ORAL | 1 refills | Status: DC

## 2017-12-20 NOTE — Progress Notes (Signed)
Subjective:  Patient ID: Robert Richmond, male    DOB: 1985/09/07  Age: 32 y.o. MRN: [id number]  CC: Allergic Rhinitis    HPI Robert Richmond presents for concerns about his sinuses.  He called me about 2 to 3 weeks ago indicating he was having a flareup of allergic and infected sinuses.  He took a 10-day course of Augmentin and a 6-day course of Medrol Dosepak and got significant improvement but now, over the last week he feels like his symptoms are worsening.  He is concerned that his symptoms may be caused  By closer exposure to his cats.  He complains of nasal congestion, postnasal drip, sneezing, and runny nose.  He denies sore throat, cough, night sweats, fever, chills, or lymphadenopathy.  Outpatient Medications Prior to Visit  Medication Sig Dispense Refill  . ibuprofen (ADVIL,MOTRIN) 200 MG tablet Take 200 mg by mouth every 6 (six) hours as needed.    . hydrOXYzine (ATARAX/VISTARIL) 50 MG tablet Take 1 tablet (50 mg total) by mouth 3 (three) times daily as needed for anxiety. (Patient not taking: Reported on 12/20/2017) 90 tablet 0   No facility-administered medications prior to visit.     ROS Review of Systems  Constitutional: Negative for chills, fatigue and fever.  HENT: Positive for congestion, postnasal drip and rhinorrhea. Negative for ear pain, facial swelling, hearing loss, nosebleeds, sinus pressure, sinus pain, sore throat, tinnitus, trouble swallowing and voice change.   Eyes: Negative for visual disturbance.  Respiratory: Negative for cough, chest tightness, shortness of breath and wheezing.   Cardiovascular: Negative for chest pain, palpitations and leg swelling.  Gastrointestinal: Negative for abdominal pain, constipation, diarrhea, nausea and vomiting.  Endocrine: Negative.   Genitourinary: Negative.  Negative for difficulty urinating and hematuria.  Musculoskeletal: Negative.  Negative for arthralgias and myalgias.  Skin: Negative.  Negative for color change, pallor and  rash.  Neurological: Negative.  Negative for dizziness, weakness and headaches.  Hematological: Negative for adenopathy. Does not bruise/bleed easily.  Psychiatric/Behavioral: Negative.     Objective:  BP 110/70   Pulse 82   Temp 97.7 F (36.5 C) (Oral)   Resp 16   Wt 171 lb (77.6 kg)   SpO2 97%   BMI 25.25 kg/m   BP Readings from Last 3 Encounters:  12/20/17 110/70  08/15/17 (!) 146/86  06/13/17 118/68    Wt Readings from Last 3 Encounters:  12/20/17 171 lb (77.6 kg)  06/13/17 169 lb 12.8 oz (77 kg)  03/05/17 177 lb 6.4 oz (80.5 kg)    Physical Exam  Constitutional: No distress.  HENT:  Right Ear: Hearing, tympanic membrane, external ear and ear canal normal.  Left Ear: Hearing, tympanic membrane, external ear and ear canal normal.  Nose: Mucosal edema present. No rhinorrhea, sinus tenderness or nasal deformity. No epistaxis.  No foreign bodies. Right sinus exhibits no maxillary sinus tenderness and no frontal sinus tenderness. Left sinus exhibits no maxillary sinus tenderness and no frontal sinus tenderness.  Mouth/Throat: Oropharynx is clear and moist. Mucous membranes are not pale, not dry and not cyanotic. No oral lesions. No trismus in the jaw. No oropharyngeal exudate, posterior oropharyngeal edema, posterior oropharyngeal erythema or tonsillar abscesses. Tonsils are 0 on the right. Tonsils are 0 on the left.  Eyes: Conjunctivae are normal. No scleral icterus.  Neck: Normal range of motion. Neck supple. No JVD present. No thyromegaly present.  Cardiovascular: Normal rate, regular rhythm and normal heart sounds. Exam reveals no friction rub.  No murmur heard.  Pulmonary/Chest: Effort normal and breath sounds normal. No respiratory distress. He has no wheezes. He has no rales.  Abdominal: Soft. Normal appearance and bowel sounds are normal. He exhibits no mass. There is no hepatosplenomegaly. There is no tenderness.  Lymphadenopathy:       Head (right side): No  tonsillar, no preauricular, no posterior auricular and no occipital adenopathy present.       Head (left side): No tonsillar, no preauricular, no posterior auricular and no occipital adenopathy present.    He has no cervical adenopathy.    He has no axillary adenopathy.       Right: No inguinal and no supraclavicular adenopathy present.       Left: No inguinal and no supraclavicular adenopathy present.  Skin: He is not diaphoretic.  Vitals reviewed.   Lab Results  Component Value Date   WBC 10.2 08/14/2017   HGB 15.1 08/14/2017   HCT 45.7 08/14/2017   PLT 241 08/14/2017   GLUCOSE 134 (H) 08/14/2017   CHOL 129 10/12/2016   TRIG 56.0 10/12/2016   HDL 45.80 10/12/2016   LDLCALC 72 10/12/2016   ALT 29 10/12/2016   AST 23 10/12/2016   NA 138 08/14/2017   K 3.6 08/14/2017   CL 102 08/14/2017   CREATININE 1.09 08/14/2017   BUN 21 (H) 08/14/2017   CO2 27 08/14/2017   TSH 2.07 10/12/2016    Dg Chest 2 View  Result Date: 08/14/2017 CLINICAL DATA:  Heart pounding, chest pain EXAM: CHEST - 2 VIEW COMPARISON:  None. FINDINGS: The heart size and mediastinal contours are within normal limits. Both lungs are clear. The visualized skeletal structures are unremarkable. IMPRESSION: No active cardiopulmonary disease. Electronically Signed   By: Jasmine PangKim  Fujinaga M.D.   On: 08/14/2017 23:03    Assessment & Plan:   Robert Richmond was seen today for allergic rhinitis .  Diagnoses and all orders for this visit:  Seasonal allergic rhinitis due to pollen -     levocetirizine (XYZAL) 5 MG tablet; Take 1 tablet (5 mg total) by mouth every evening. -     montelukast (SINGULAIR) 10 MG tablet; Take 1 tablet (10 mg total) by mouth at bedtime. -     fluticasone (FLONASE) 50 MCG/ACT nasal spray; Place 2 sprays into both nostrils daily. -     methylPREDNISolone (MEDROL DOSEPAK) 4 MG TBPK tablet; TAKE AS DIRECTED   I have discontinued Robert Richmond Richmond's hydrOXYzine. I am also having him start on levocetirizine,  montelukast, fluticasone, and methylPREDNISolone. Additionally, I am having him maintain his ibuprofen.  Meds ordered this encounter  Medications  . levocetirizine (XYZAL) 5 MG tablet    Sig: Take 1 tablet (5 mg total) by mouth every evening.    Dispense:  90 tablet    Refill:  1  . montelukast (SINGULAIR) 10 MG tablet    Sig: Take 1 tablet (10 mg total) by mouth at bedtime.    Dispense:  90 tablet    Refill:  1  . fluticasone (FLONASE) 50 MCG/ACT nasal spray    Sig: Place 2 sprays into both nostrils daily.    Dispense:  48 g    Refill:  1  . methylPREDNISolone (MEDROL DOSEPAK) 4 MG TBPK tablet    Sig: TAKE AS DIRECTED    Dispense:  21 tablet    Refill:  0     Follow-up: Return if symptoms worsen or fail to improve.  Sanda Lingerhomas Arlee Bossard, MD

## 2017-12-20 NOTE — Patient Instructions (Signed)

## 2017-12-24 ENCOUNTER — Telehealth: Payer: Self-pay | Admitting: Internal Medicine

## 2017-12-24 NOTE — Telephone Encounter (Signed)
Left patient vm to call back to schedule transfer appt to Dr. Yetta BarreJones.  This had been Ok by Yetta BarreJones and Earlene PlaterWallace through [employer] messages.

## 2018-09-23 ENCOUNTER — Ambulatory Visit (INDEPENDENT_AMBULATORY_CARE_PROVIDER_SITE_OTHER): Payer: 59 | Admitting: Internal Medicine

## 2018-09-23 ENCOUNTER — Other Ambulatory Visit (INDEPENDENT_AMBULATORY_CARE_PROVIDER_SITE_OTHER): Payer: 59

## 2018-09-23 ENCOUNTER — Other Ambulatory Visit: Payer: Self-pay

## 2018-09-23 ENCOUNTER — Encounter: Payer: Self-pay | Admitting: Internal Medicine

## 2018-09-23 VITALS — BP 116/78 | HR 79 | Temp 97.7°F | Resp 16 | Ht 69.0 in | Wt 173.0 lb

## 2018-09-23 DIAGNOSIS — Z20818 Contact with and (suspected) exposure to other bacterial communicable diseases: Secondary | ICD-10-CM | POA: Diagnosis not present

## 2018-09-23 DIAGNOSIS — B349 Viral infection, unspecified: Secondary | ICD-10-CM

## 2018-09-23 DIAGNOSIS — Z7252 High risk homosexual behavior: Secondary | ICD-10-CM | POA: Diagnosis not present

## 2018-09-23 LAB — CBC WITH DIFFERENTIAL/PLATELET
Basophils Absolute: 0 10*3/uL (ref 0.0–0.1)
Basophils Relative: 0.2 % (ref 0.0–3.0)
Eosinophils Absolute: 0 10*3/uL (ref 0.0–0.7)
Eosinophils Relative: 0.4 % (ref 0.0–5.0)
HCT: 45.9 % (ref 39.0–52.0)
Hemoglobin: 15.5 g/dL (ref 13.0–17.0)
Lymphocytes Relative: 17.6 % (ref 12.0–46.0)
Lymphs Abs: 1.8 10*3/uL (ref 0.7–4.0)
MCHC: 33.8 g/dL (ref 30.0–36.0)
MCV: 84.5 fl (ref 78.0–100.0)
Monocytes Absolute: 0.9 10*3/uL (ref 0.1–1.0)
Monocytes Relative: 8.6 % (ref 3.0–12.0)
Neutro Abs: 7.4 10*3/uL (ref 1.4–7.7)
Neutrophils Relative %: 73.2 % (ref 43.0–77.0)
Platelets: 205 10*3/uL (ref 150.0–400.0)
RBC: 5.43 Mil/uL (ref 4.22–5.81)
RDW: 13.4 % (ref 11.5–15.5)
WBC: 10.1 10*3/uL (ref 4.0–10.5)

## 2018-09-23 LAB — COMPREHENSIVE METABOLIC PANEL
ALT: 19 U/L (ref 0–53)
AST: 18 U/L (ref 0–37)
Albumin: 4.6 g/dL (ref 3.5–5.2)
Alkaline Phosphatase: 68 U/L (ref 39–117)
BUN: 15 mg/dL (ref 6–23)
CO2: 30 mEq/L (ref 19–32)
Calcium: 9.3 mg/dL (ref 8.4–10.5)
Chloride: 102 mEq/L (ref 96–112)
Creatinine, Ser: 1.12 mg/dL (ref 0.40–1.50)
GFR: 75.62 mL/min (ref >60.00)
Glucose, Bld: 80 mg/dL (ref 70–99)
Potassium: 4.1 mEq/L (ref 3.5–5.1)
Sodium: 138 mEq/L (ref 135–145)
Total Bilirubin: 0.8 mg/dL (ref 0.2–1.2)
Total Protein: 7.4 g/dL (ref 6.0–8.3)

## 2018-09-23 MED ORDER — AZITHROMYCIN 500 MG PO TABS: 500.0000 mg | ORAL_TABLET | Freq: Every day | ORAL | 0 refills | Status: AC

## 2018-09-23 NOTE — Patient Instructions (Signed)
Viral Illness, Adult °Viruses are tiny germs that can get into a person's body and cause illness. There are many different types of viruses, and they cause many types of illness. Viral illnesses can range from mild to severe. They can affect various parts of the body. °Common illnesses that are caused by a virus include colds and the flu. Viral illnesses also include serious conditions such as HIV/AIDS (human immunodeficiency virus/acquired immunodeficiency syndrome). A few viruses have been linked to certain cancers. °What are the causes? °Many types of viruses can cause illness. Viruses invade cells in your body, multiply, and cause the infected cells to malfunction or die. When the cell dies, it releases more of the virus. When this happens, you develop symptoms of the illness, and the virus continues to spread to other cells. If the virus takes over the function of the cell, it can cause the cell to divide and grow out of control, as is the case when a virus causes cancer. °Different viruses get into the body in different ways. You can get a virus by: °· Swallowing food or water that is contaminated with the virus. °· Breathing in droplets that have been coughed or sneezed into the air by an infected person. °· Touching a surface that has been contaminated with the virus and then touching your eyes, nose, or mouth. °· Being bitten by an insect or animal that carries the virus. °· Having sexual contact with a person who is infected with the virus. °· Being exposed to blood or fluids that contain the virus, either through an open cut or during a transfusion. °If a virus enters your body, your body's defense system (immune system) will try to fight the virus. You may be at higher risk for a viral illness if your immune system is weak. °What are the signs or symptoms? °Symptoms vary depending on the type of virus and the location of the cells that it invades. Common symptoms of the main types of viral illnesses  include: °Cold and flu viruses °· Fever. °· Headache. °· Sore throat. °· Muscle aches. °· Nasal congestion. °· Cough. °Digestive system (gastrointestinal) viruses °· Fever. °· Abdominal pain. °· Nausea. °· Diarrhea. °Liver viruses (hepatitis) °· Loss of appetite. °· Tiredness. °· Yellowing of the skin (jaundice). °Brain and spinal cord viruses °· Fever. °· Headache. °· Stiff neck. °· Nausea and vomiting. °· Confusion or sleepiness. °Skin viruses °· Warts. °· Itching. °· Rash. °Sexually transmitted viruses °· Discharge. °· Swelling. °· Redness. °· Rash. °How is this treated? °Viruses can be difficult to treat because they live within cells. Antibiotic medicines do not treat viruses because these drugs do not get inside cells. Treatment for a viral illness may include: °· Resting and drinking plenty of fluids. °· Medicines to relieve symptoms. These can include over-the-counter medicine for pain and fever, medicines for cough or congestion, and medicines to relieve diarrhea. °· Antiviral medicines. These drugs are available only for certain types of viruses. They may help reduce flu symptoms if taken early. There are also many antiviral medicines for hepatitis and HIV/AIDS. °Some viral illnesses can be prevented with vaccinations. A common example is the flu shot. °Follow these instructions at home: °Medicines ° °· Take over-the-counter and prescription medicines only as told by your health care provider. °· If you were prescribed an antiviral medicine, take it as told by your health care provider. Do not stop taking the medicine even if you start to feel better. °· Be aware of when   antibiotics are needed and when they are not needed. Antibiotics do not treat viruses. If your health care provider thinks that you may have a bacterial infection as well as a viral infection, you may get an antibiotic. °? Do not ask for an antibiotic prescription if you have been diagnosed with a viral illness. That will not make your  illness go away faster. °? Frequently taking antibiotics when they are not needed can lead to antibiotic resistance. When this develops, the medicine no longer works against the bacteria that it normally fights. °General instructions °· Drink enough fluids to keep your urine clear or pale yellow. °· Rest as much as possible. °· Return to your normal activities as told by your health care provider. Ask your health care provider what activities are safe for you. °· Keep all follow-up visits as told by your health care provider. This is important. °How is this prevented? °Take these actions to reduce your risk of viral infection: °· Eat a healthy diet and get enough rest. °· Wash your hands often with soap and water. This is especially important when you are in public places. If soap and water are not available, use hand sanitizer. °· Avoid close contact with friends and family who have a viral illness. °· If you travel to areas where viral gastrointestinal infection is common, avoid drinking water or eating raw food. °· Keep your immunizations up to date. Get a flu shot every year as told by your health care provider. °· Do not share toothbrushes, nail clippers, razors, or needles with other people. °· Always practice safe sex. ° °Contact a health care provider if: °· You have symptoms of a viral illness that do not go away. °· Your symptoms come back after going away. °· Your symptoms get worse. °Get help right away if: °· You have trouble breathing. °· You have a severe headache or a stiff neck. °· You have severe vomiting or abdominal pain. °This information is not intended to replace advice given to you by your health care provider. Make sure you discuss any questions you have with your health care provider. °Document Released: 10/07/2015 Document Revised: 11/09/2015 Document Reviewed: 10/07/2015 °Elsevier Interactive Patient Education © 2019 [employer] ° °

## 2018-09-23 NOTE — Progress Notes (Addendum)
Subjective:  Patient ID: Robert Richmond, male    DOB: 01/03/1986  Age: 33 y.o. MRN: [id number]  CC: Chills   HPI Robert FinnerBrian Lazenby presents for the complaint of a 2-day history of chills, muscle aches, and fatigue.  He and his partner are not monogamous and have had 1 other sexual partner in the last few weeks.  Outpatient Medications Prior to Visit  Medication Sig Dispense Refill  . acetaminophen (TYLENOL) 325 MG tablet Take 650 mg by mouth every 6 (six) hours as needed.    Marland Kitchen. ibuprofen (ADVIL,MOTRIN) 200 MG tablet Take 200 mg by mouth every 6 (six) hours as needed.    . fluticasone (FLONASE) 50 MCG/ACT nasal spray Place 2 sprays into both nostrils daily. (Patient not taking: Reported on 09/23/2018) 48 g 1  . levocetirizine (XYZAL) 5 MG tablet Take 1 tablet (5 mg total) by mouth every evening. (Patient not taking: Reported on 09/23/2018) 90 tablet 1  . montelukast (SINGULAIR) 10 MG tablet Take 1 tablet (10 mg total) by mouth at bedtime. (Patient not taking: Reported on 09/23/2018) 90 tablet 1   No facility-administered medications prior to visit.     ROS Review of Systems  Constitutional: Positive for chills and fatigue. Negative for fever and unexpected weight change.  HENT: Positive for sore throat.   Respiratory: Negative for cough and shortness of breath.   Cardiovascular: Negative for chest pain.  Gastrointestinal: Negative for abdominal pain, diarrhea and nausea.  Genitourinary: Negative for difficulty urinating, dysuria, genital sores, penile pain, penile swelling, scrotal swelling, testicular pain and urgency.  Musculoskeletal: Positive for myalgias. Negative for arthralgias and neck pain.  Skin: Negative for color change and rash.  Neurological: Negative.  Negative for dizziness, weakness, light-headedness and numbness.  Hematological: Positive for adenopathy. Does not bruise/bleed easily.  Psychiatric/Behavioral: Negative.     Objective:  BP 116/78   Pulse 79   Temp 97.7 F (36.5  C)   Resp 16   Ht 5\' 9"  (1.753 m)   Wt 173 lb (78.5 kg)   SpO2 99%   BMI 25.55 kg/m   BP Readings from Last 3 Encounters:  09/23/18 116/78  12/20/17 110/70  08/15/17 (!) 146/86    Wt Readings from Last 3 Encounters:  09/23/18 173 lb (78.5 kg)  12/20/17 171 lb (77.6 kg)  06/13/17 169 lb 12.8 oz (77 kg)    Physical Exam Vitals signs reviewed.  Constitutional:      General: He is not in acute distress.    Appearance: He is not ill-appearing, toxic-appearing or diaphoretic.  HENT:     Mouth/Throat:     Mouth: Mucous membranes are moist.     Pharynx: Posterior oropharyngeal erythema present. No oropharyngeal exudate.     Tonsils: No tonsillar exudate.  Eyes:     General: No scleral icterus.    Conjunctiva/sclera: Conjunctivae normal.  Neck:     Musculoskeletal: Normal range of motion. No neck rigidity or muscular tenderness.     Thyroid: No thyroid mass.  Cardiovascular:     Rate and Rhythm: Normal rate and regular rhythm.     Heart sounds: No murmur.  Pulmonary:     Effort: Pulmonary effort is normal.     Breath sounds: No stridor. No wheezing, rhonchi or rales.  Abdominal:     General: Abdomen is flat. Bowel sounds are normal.     Palpations: There is no mass.     Tenderness: There is no abdominal tenderness. There is no guarding.  Musculoskeletal:  Normal range of motion.        General: No swelling.  Lymphadenopathy:     Head:     Right side of head: No occipital adenopathy.     Left side of head: No occipital adenopathy.     Cervical: Cervical adenopathy present.     Right cervical: No superficial, deep or posterior cervical adenopathy.    Left cervical: Superficial cervical adenopathy present. No deep or posterior cervical adenopathy.     Upper Body:     Right upper body: No supraclavicular or axillary adenopathy.     Left upper body: No supraclavicular or axillary adenopathy.     Lower Body: No right inguinal adenopathy. No left inguinal adenopathy.  Skin:     General: Skin is warm.     Coloration: Skin is not pale.     Findings: No erythema or rash.  Neurological:     General: No focal deficit present.     Mental Status: He is oriented to person, place, and time. Mental status is at baseline.     Lab Results  Component Value Date   WBC 10.1 09/23/2018   HGB 15.5 09/23/2018   HCT 45.9 09/23/2018   PLT 205.0 09/23/2018   GLUCOSE 80 09/23/2018   CHOL 129 10/12/2016   TRIG 56.0 10/12/2016   HDL 45.80 10/12/2016   LDLCALC 72 10/12/2016   ALT 19 09/23/2018   AST 18 09/23/2018   NA 138 09/23/2018   K 4.1 09/23/2018   CL 102 09/23/2018   CREATININE 1.12 09/23/2018   BUN 15 09/23/2018   CO2 30 09/23/2018   TSH 2.07 10/12/2016    Dg Chest 2 View  Result Date: 08/14/2017 CLINICAL DATA:  Heart pounding, chest pain EXAM: CHEST - 2 VIEW COMPARISON:  None. FINDINGS: The heart size and mediastinal contours are within normal limits. Both lungs are clear. The visualized skeletal structures are unremarkable. IMPRESSION: No active cardiopulmonary disease. Electronically Signed   By: Jasmine Pang M.D.   On: 08/14/2017 23:03    Assessment & Plan:   Lonnel was seen today for chills.  Diagnoses and all orders for this visit:  High risk homosexual behavior- I will screen him for hep A and B.  If he does not have protective antibodies then I will recommend that he be vaccinated against hepatitis A and B. -     CBC with Differential/Platelet; Future -     Hepatitis A antibody, total; Future -     Hepatitis B core antibody, total; Future -     Hepatitis B surface antibody,qualitative; Future -     Comprehensive metabolic panel; Future  Viral illness- His CBC and CMP are normal.  I reassured him that I think he has a viral illness not related to COVID-19 and/or early signs of streptococcal tonsillitis. -     CBC with Differential/Platelet; Future -     Hepatitis A antibody, total; Future -     Hepatitis B core antibody, total; Future -      Hepatitis B surface antibody,qualitative; Future -     Comprehensive metabolic panel; Future  Exposure to strep throat- His partner was diagnosed with strep pharyngitis today.  I will empirically treat him with a 3-day course of azithromycin. -     azithromycin (ZITHROMAX) 500 MG tablet; Take 1 tablet (500 mg total) by mouth daily for 3 days.   I am having Robert Finner start on azithromycin. I am also having him maintain his ibuprofen, levocetirizine,  montelukast, fluticasone, and acetaminophen.  Meds ordered this encounter  Medications  . azithromycin (ZITHROMAX) 500 MG tablet    Sig: Take 1 tablet (500 mg total) by mouth daily for 3 days.    Dispense:  3 tablet    Refill:  0     Follow-up: Return if symptoms worsen or fail to improve.  [name], MD

## 2018-09-24 ENCOUNTER — Encounter: Payer: Self-pay | Admitting: Internal Medicine

## 2018-09-24 ENCOUNTER — Other Ambulatory Visit: Payer: Self-pay | Admitting: Internal Medicine

## 2018-09-24 DIAGNOSIS — R768 Other specified abnormal immunological findings in serum: Secondary | ICD-10-CM

## 2018-09-24 LAB — HEPATITIS B CORE ANTIBODY, TOTAL: Hep B Core Total Ab: REACTIVE — AB

## 2018-09-24 LAB — HEPATITIS B SURFACE ANTIBODY,QUALITATIVE: Hep B S Ab: NONREACTIVE

## 2018-09-24 LAB — HEPATITIS A ANTIBODY, TOTAL: Hepatitis A AB,Total: NONREACTIVE

## 2019-03-04 ENCOUNTER — Other Ambulatory Visit: Payer: Self-pay | Admitting: Emergency Medicine

## 2019-03-04 ENCOUNTER — Telehealth: Payer: Self-pay

## 2019-03-04 DIAGNOSIS — Z20822 Contact with and (suspected) exposure to covid-19: Secondary | ICD-10-CM

## 2019-03-04 DIAGNOSIS — Z20828 Contact with and (suspected) exposure to other viral communicable diseases: Secondary | ICD-10-CM

## 2019-03-04 NOTE — Telephone Encounter (Signed)
Pt wants to be tested for COVID - pt contacted and given instructions for testing and location. Order entered.

## 2019-03-06 ENCOUNTER — Encounter: Payer: Self-pay | Admitting: Internal Medicine

## 2019-03-06 LAB — NOVEL CORONAVIRUS, NAA: SARS-CoV-2, NAA: NOT DETECTED

## 2019-03-23 IMAGING — CR DG CHEST 2V
1 series · 2 of 2 positions shown · non-contrast
Comparison: None.

CLINICAL DATA: Heart pounding, chest pain

EXAM:
CHEST - 2 VIEW

[Series 1: dg chest 2 view · 0.14mm/px · 2 of 2 slices shown]
[im 1/2]
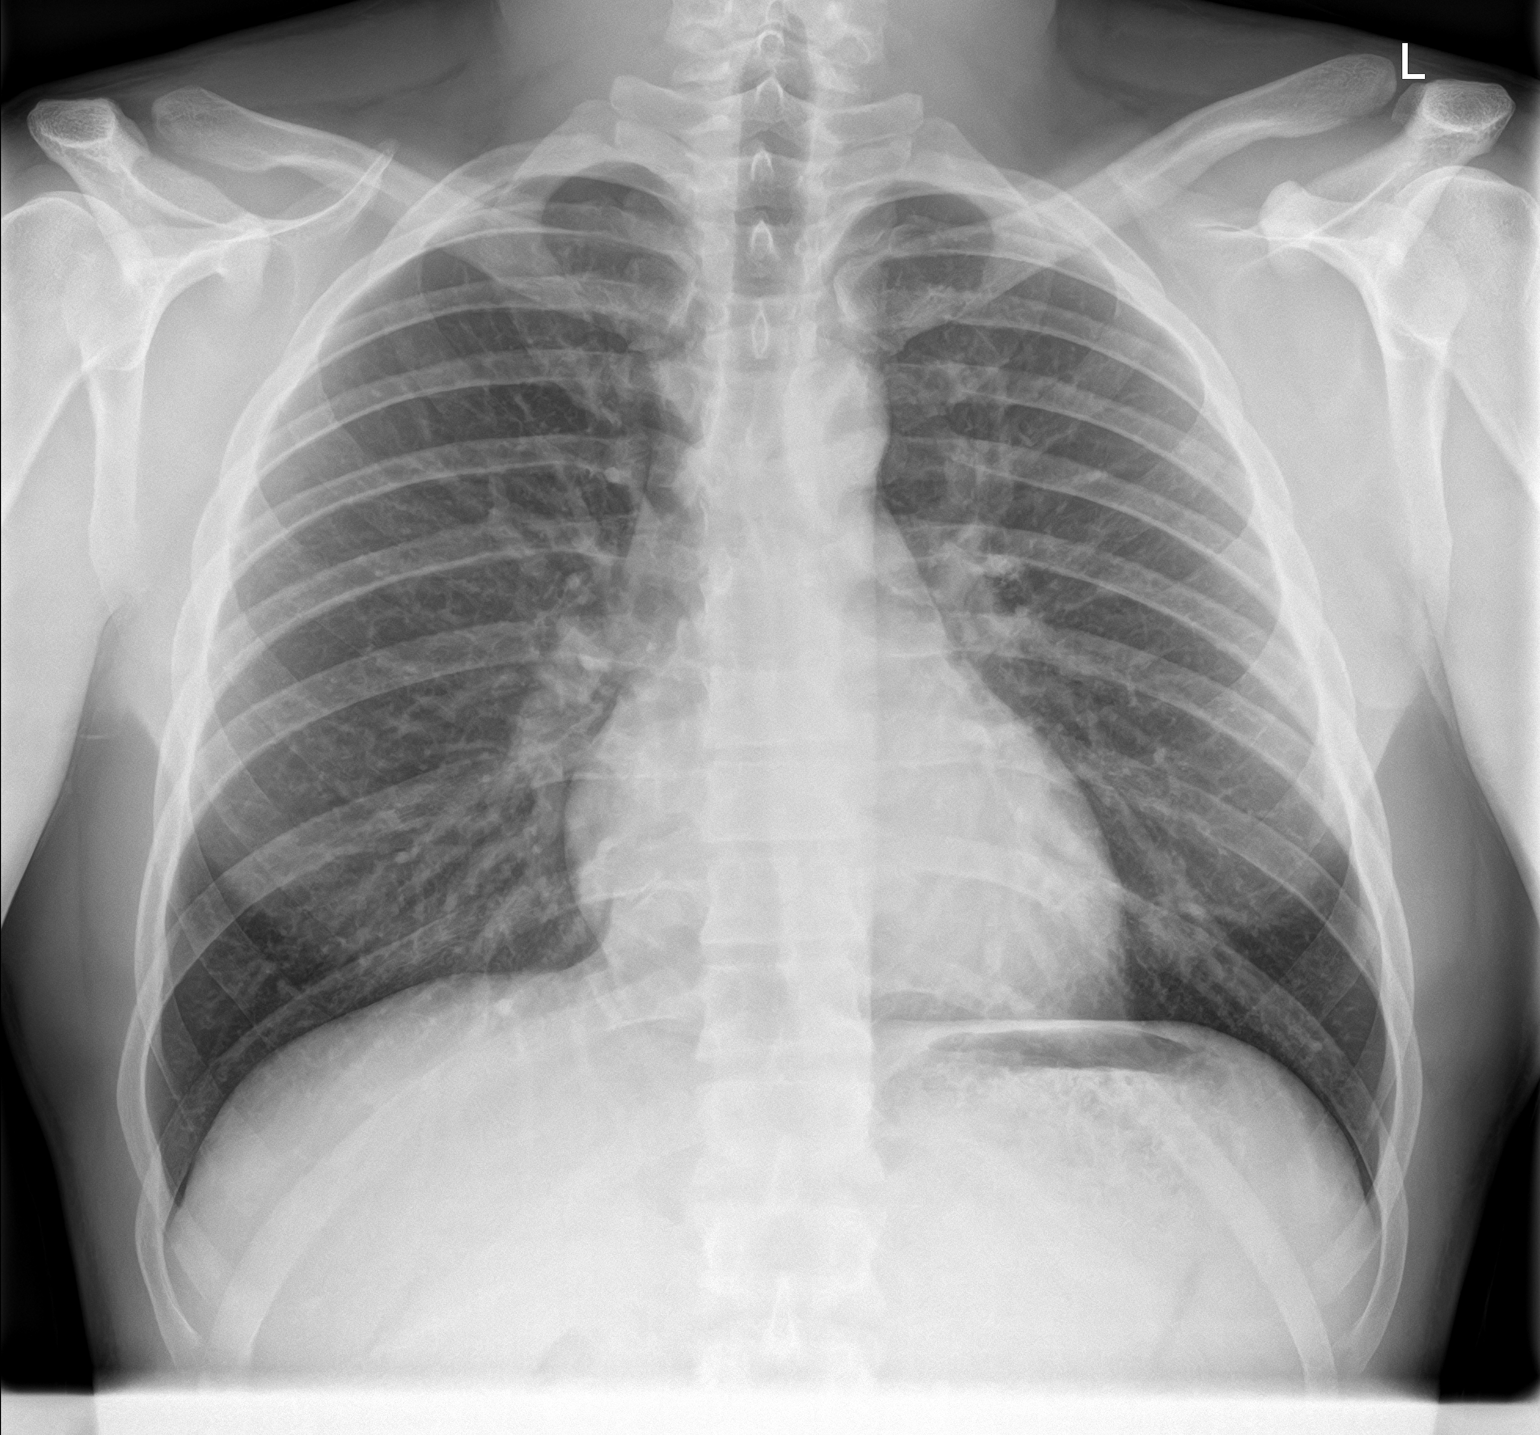
[im 2/2]
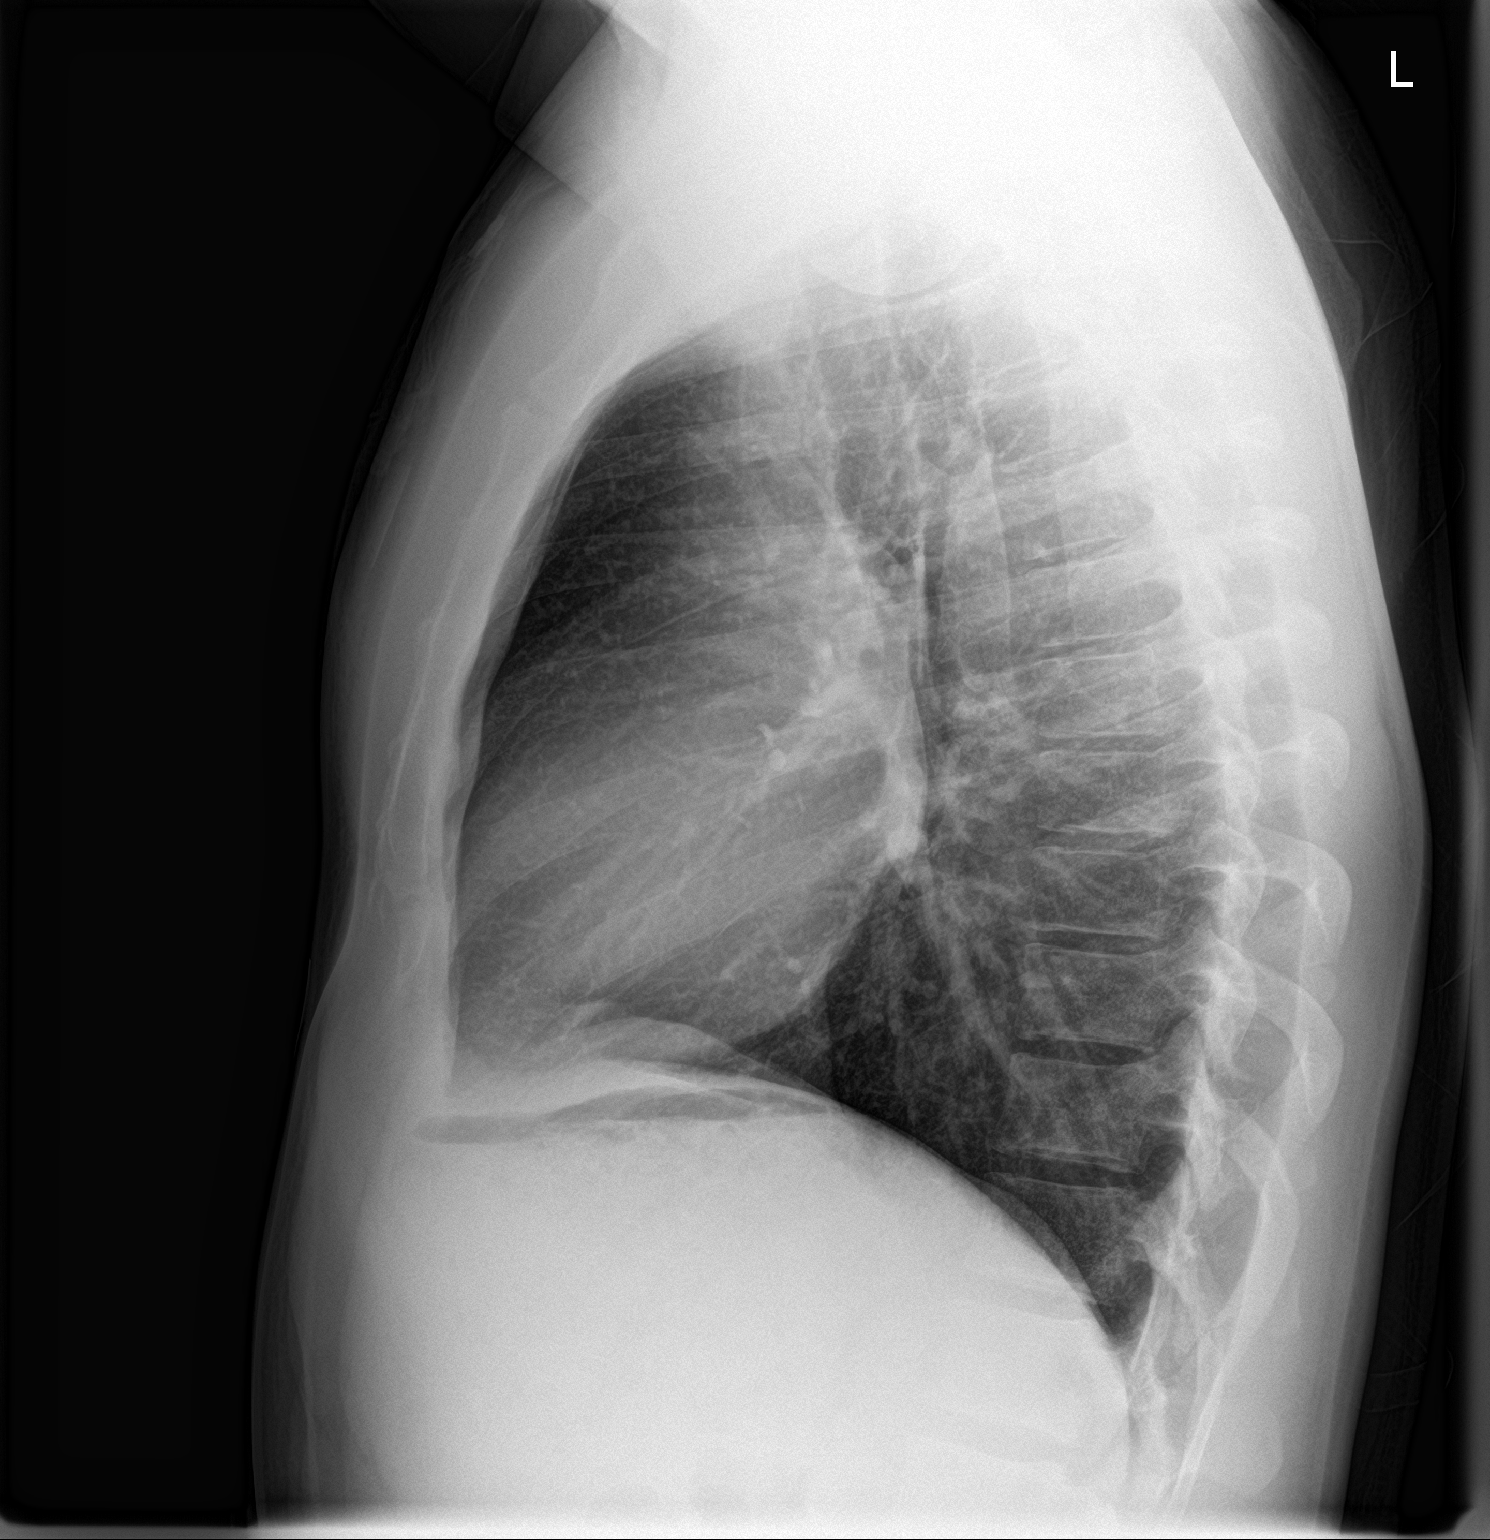

[2 of 2 positions shown; findings below may reference images not displayed]

FINDINGS: The heart size and mediastinal contours are within normal limits.
Both lungs are clear. The visualized skeletal structures are
unremarkable.
IMPRESSION: No active cardiopulmonary disease.

## 2019-04-02 ENCOUNTER — Other Ambulatory Visit: Payer: Self-pay

## 2019-04-02 DIAGNOSIS — Z20822 Contact with and (suspected) exposure to covid-19: Secondary | ICD-10-CM

## 2019-04-04 LAB — NOVEL CORONAVIRUS, NAA: SARS-CoV-2, NAA: NOT DETECTED

## 2019-04-07 ENCOUNTER — Other Ambulatory Visit: Payer: Self-pay | Admitting: Internal Medicine

## 2019-04-07 ENCOUNTER — Encounter: Payer: Self-pay | Admitting: Internal Medicine

## 2019-04-09 ENCOUNTER — Encounter: Payer: Self-pay | Admitting: Internal Medicine

## 2019-04-09 ENCOUNTER — Ambulatory Visit (INDEPENDENT_AMBULATORY_CARE_PROVIDER_SITE_OTHER): Payer: 59 | Admitting: Internal Medicine

## 2019-04-09 ENCOUNTER — Other Ambulatory Visit: Payer: Self-pay

## 2019-04-09 ENCOUNTER — Other Ambulatory Visit (INDEPENDENT_AMBULATORY_CARE_PROVIDER_SITE_OTHER): Payer: 59

## 2019-04-09 VITALS — BP 126/76 | HR 64 | Temp 98.3°F | Resp 16 | Ht 69.0 in | Wt 180.8 lb

## 2019-04-09 DIAGNOSIS — R350 Frequency of micturition: Secondary | ICD-10-CM | POA: Diagnosis not present

## 2019-04-09 LAB — BASIC METABOLIC PANEL
BUN: 14 mg/dL (ref 6–23)
CO2: 32 mEq/L (ref 19–32)
Calcium: 9.5 mg/dL (ref 8.4–10.5)
Chloride: 103 mEq/L (ref 96–112)
Creatinine, Ser: 1.01 mg/dL (ref 0.40–1.50)
GFR: 84.92 mL/min (ref >60.00)
Glucose, Bld: 90 mg/dL (ref 70–99)
Potassium: 4.2 mEq/L (ref 3.5–5.1)
Sodium: 139 mEq/L (ref 135–145)

## 2019-04-09 LAB — URINALYSIS, ROUTINE W REFLEX MICROSCOPIC
Bilirubin Urine: NEGATIVE
Hgb urine dipstick: NEGATIVE
Ketones, ur: NEGATIVE
Leukocytes,Ua: NEGATIVE
Nitrite: NEGATIVE
RBC / HPF: NONE SEEN (ref 0–?)
Specific Gravity, Urine: <=1.005 — AB (ref 1.000–1.030)
Total Protein, Urine: NEGATIVE
Urine Glucose: NEGATIVE
Urobilinogen, UA: 0.2 (ref 0.0–1.0)
pH: 7 (ref 5.0–8.0)

## 2019-04-09 NOTE — Progress Notes (Signed)
Subjective:  Patient ID: Robert Richmond, male    DOB: 1985/10/06  Age: 33 y.o. MRN: [id number]  CC: Urinary Frequency   HPI Robert Richmond presents for concerns about a 3-day history of urinary frequency.  He does not complain of excessive thirst.  He tells me that he drinks about 80 ounces of urine a day and suffers from catastrophic anxiety.  He was concerned that the frequent urination was a sign that he was dying.  He has gotten up about 2 times to urinate and then urinates about 10-12 times during the day.  He also complains of dizziness and lightheadedness.  Outpatient Medications Prior to Visit  Medication Sig Dispense Refill  . Ascorbic Acid (VITAMIN C PO) Take by mouth.    . ELDERBERRY PO Take by mouth.    Marland Kitchen ibuprofen (ADVIL,MOTRIN) 200 MG tablet Take 200 mg by mouth every 6 (six) hours as needed.    . Multiple Vitamins-Minerals (VITAMIN D3 COMPLETE PO) Take by mouth.    Marland Kitchen acetaminophen (TYLENOL) 325 MG tablet Take 650 mg by mouth every 6 (six) hours as needed.    . fluticasone (FLONASE) 50 MCG/ACT nasal spray Place 2 sprays into both nostrils daily. (Patient not taking: Reported on 09/23/2018) 48 g 1  . levocetirizine (XYZAL) 5 MG tablet Take 1 tablet (5 mg total) by mouth every evening. (Patient not taking: Reported on 09/23/2018) 90 tablet 1  . montelukast (SINGULAIR) 10 MG tablet Take 1 tablet (10 mg total) by mouth at bedtime. (Patient not taking: Reported on 09/23/2018) 90 tablet 1   No facility-administered medications prior to visit.     ROS Review of Systems  Constitutional: Positive for unexpected weight change (wt gain). Negative for diaphoresis and fatigue.  HENT: Negative.   Eyes: Negative.   Respiratory: Negative for cough, chest tightness, shortness of breath and wheezing.   Cardiovascular: Negative for chest pain, palpitations and leg swelling.  Gastrointestinal: Negative for abdominal pain, constipation, diarrhea, nausea and vomiting.  Endocrine: Positive for  polydipsia and polyuria. Negative for polyphagia.  Genitourinary: Positive for frequency. Negative for decreased urine volume, difficulty urinating, discharge, dysuria, hematuria and urgency.  Musculoskeletal: Negative.  Negative for arthralgias and myalgias.  Skin: Negative for rash.  Neurological: Positive for dizziness and light-headedness. Negative for weakness and numbness.  Hematological: Negative for adenopathy. Does not bruise/bleed easily.  Psychiatric/Behavioral: Negative for suicidal ideas. The patient is nervous/anxious.     Objective:  BP 126/76 (BP Location: Left Arm, Patient Position: Sitting, Cuff Size: Large)   Pulse 64   Temp 98.3 F (36.8 C) (Oral)   Resp 16   Ht 5\' 9"  (1.753 m)   Wt 180 lb 12 oz (82 kg)   SpO2 98%   BMI 26.69 kg/m   BP Readings from Last 3 Encounters:  04/09/19 126/76  09/23/18 116/78  12/20/17 110/70    Wt Readings from Last 3 Encounters:  04/09/19 180 lb 12 oz (82 kg)  09/23/18 173 lb (78.5 kg)  12/20/17 171 lb (77.6 kg)    Physical Exam Vitals signs reviewed.  Constitutional:      Appearance: Normal appearance.  HENT:     Nose: Nose normal.     Mouth/Throat:     Mouth: Mucous membranes are moist.  Eyes:     General: No scleral icterus.    Conjunctiva/sclera: Conjunctivae normal.  Neck:     Musculoskeletal: Neck supple.  Cardiovascular:     Rate and Rhythm: Normal rate and regular rhythm.  Heart sounds: No murmur.  Pulmonary:     Effort: Pulmonary effort is normal.     Breath sounds: No stridor. No wheezing, rhonchi or rales.  Abdominal:     General: Abdomen is flat. Bowel sounds are normal.     Palpations: There is no hepatomegaly or splenomegaly.     Tenderness: There is no abdominal tenderness.  Musculoskeletal: Normal range of motion.     Right lower leg: No edema.     Left lower leg: No edema.  Lymphadenopathy:     Cervical: No cervical adenopathy.  Skin:    General: Skin is warm and dry.  Neurological:      General: No focal deficit present.     Mental Status: He is alert.  Psychiatric:        Attention and Perception: Attention and perception normal.        Mood and Affect: Mood is anxious.        Speech: Speech normal.        Behavior: Behavior normal.        Thought Content: Thought content normal.     Lab Results  Component Value Date   WBC 10.1 09/23/2018   HGB 15.5 09/23/2018   HCT 45.9 09/23/2018   PLT 205.0 09/23/2018   GLUCOSE 90 04/09/2019   CHOL 129 10/12/2016   TRIG 56.0 10/12/2016   HDL 45.80 10/12/2016   LDLCALC 72 10/12/2016   ALT 19 09/23/2018   AST 18 09/23/2018   NA 139 04/09/2019   K 4.2 04/09/2019   CL 103 04/09/2019   CREATININE 1.01 04/09/2019   BUN 14 04/09/2019   CO2 32 04/09/2019   TSH 2.07 10/12/2016    Dg Chest 2 View  Result Date: 08/14/2017 CLINICAL DATA:  Heart pounding, chest pain EXAM: CHEST - 2 VIEW COMPARISON:  None. FINDINGS: The heart size and mediastinal contours are within normal limits. Both lungs are clear. The visualized skeletal structures are unremarkable. IMPRESSION: No active cardiopulmonary disease. Electronically Signed   By: Jasmine Pang M.D.   On: 08/14/2017 23:03    Assessment & Plan:   Mardell was seen today for urinary frequency.  Diagnoses and all orders for this visit:  Frequent urination- His urine is diluted but his BUN, creatinine, and electrolytes are normal.  I think the frequent urination is a combination of polydipsia and anxiety.  I have recommended that he decrease his intake of liquids by about 50%. -     Basic metabolic panel; Future -     Urinalysis, Routine w reflex microscopic; Future   I have discontinued Robert Richmond's levocetirizine, montelukast, fluticasone, and acetaminophen. I am also having him maintain his ibuprofen, ELDERBERRY PO, Multiple Vitamins-Minerals (VITAMIN D3 COMPLETE PO), and Ascorbic Acid (VITAMIN C PO).  No orders of the defined types were placed in this encounter.    Follow-up:  No follow-ups on file.  [name], MD

## 2019-04-09 NOTE — Patient Instructions (Signed)

## 2019-10-16 ENCOUNTER — Other Ambulatory Visit: Payer: Self-pay

## 2019-10-16 ENCOUNTER — Ambulatory Visit: Payer: Self-pay | Admitting: Physician Assistant

## 2019-10-16 DIAGNOSIS — Z113 Encounter for screening for infections with a predominantly sexual mode of transmission: Secondary | ICD-10-CM

## 2019-10-16 DIAGNOSIS — Z202 Contact with and (suspected) exposure to infections with a predominantly sexual mode of transmission: Secondary | ICD-10-CM

## 2019-10-16 MED ORDER — CEFTRIAXONE SODIUM 250 MG IJ SOLR
500.0000 mg | Freq: Once | INTRAMUSCULAR | Status: AC
  Administered 2019-10-16: 11:00:00 500 mg via INTRAMUSCULAR

## 2019-10-16 MED ORDER — DOXYCYCLINE HYCLATE 100 MG PO TABS: 100.0000 mg | ORAL_TABLET | Freq: Two times a day (BID) | ORAL | 0 refills | Status: AC

## 2019-10-16 NOTE — Progress Notes (Signed)
Pt treated per provider orders. Provider orders completed. 

## 2019-10-17 ENCOUNTER — Encounter: Payer: Self-pay | Admitting: Physician Assistant

## 2019-10-17 NOTE — Progress Notes (Signed)
   [facility] Department STI clinic/screening visit  Subjective:  Robert Richmond is a 34 y.o. male being seen today for an STI screening visit. The patient reports they do not have symptoms.    Patient has the following medical conditions:   Patient Active Problem List   Diagnosis Date Noted  . Frequent urination 04/09/2019  . Hepatitis B core antibody positive 09/24/2018  . High risk homosexual behavior 09/23/2018  . Seasonal allergies   . Generalized anxiety disorder 05/28/2014  . Gastroesophageal reflux disease without esophagitis 05/28/2014     Chief Complaint  Patient presents with  . SEXUALLY TRANSMITTED DISEASE    screening    HPI  Patient reports that he is not having any symptoms but is a contact to [facility].  Declines screening exam, testing and blood work today.  Denies history of any surgery and regular medications.  Reports history of anxiety with no current symptoms or counseling.     See flowsheet for further details and programmatic requirements.    The following portions of the patient's history were reviewed and updated as appropriate: allergies, current medications, past medical history, past social history, past surgical history and problem list.  Objective:  There were no vitals filed for this visit.  Physical Exam Constitutional:      General: He is not in acute distress.    Appearance: Normal appearance. He is normal weight.  HENT:     Head: Normocephalic and atraumatic.  Pulmonary:     Effort: Pulmonary effort is normal.  Neurological:     Mental Status: He is alert and oriented to person, place, and time.  Psychiatric:        Mood and Affect: Mood normal.        Behavior: Behavior normal.        Thought Content: Thought content normal.        Judgment: Judgment normal.       Assessment and Plan:  Robert Richmond is a 34 y.o. male presenting to the [facility] Department for STI screening  1. Screening for STD (sexually  transmitted disease) Patient into clinic without symptoms.  Declines any screening exam, testing and blood work today. Rec condoms with all sex.   2. Gonorrhea contact Will treat as a contact to [facility] and to cover for Chlamydia with Ceftriaxone 500mg  IM and Doxycycline 100mg  #14 1 po BID for 7 days No sex for 7 days until after completing medicine and until after partner/s completes treatment. Call with any questions or concerns.  - cefTRIAXone (ROCEPHIN) injection 500 mg - doxycycline (VIBRA-TABS) 100 MG tablet; Take 1 tablet (100 mg total) by mouth 2 (two) times daily for 7 days.  Dispense: 14 tablet; Refill: 0     No follow-ups on file.  No future appointments.  , PA

## 2020-01-21 ENCOUNTER — Ambulatory Visit: Payer: 59 | Admitting: Internal Medicine

## 2020-01-21 ENCOUNTER — Other Ambulatory Visit: Payer: Self-pay

## 2020-01-21 ENCOUNTER — Encounter: Payer: Self-pay | Admitting: Internal Medicine

## 2020-01-21 VITALS — BP 120/64 | HR 60 | Temp 98.2°F | Resp 16 | Ht 69.0 in | Wt 180.2 lb

## 2020-01-21 DIAGNOSIS — M546 Pain in thoracic spine: Secondary | ICD-10-CM | POA: Diagnosis not present

## 2020-01-21 DIAGNOSIS — M7542 Impingement syndrome of left shoulder: Secondary | ICD-10-CM

## 2020-01-21 MED ORDER — ETODOLAC ER 400 MG PO TB24: 400.0000 mg | ORAL_TABLET | Freq: Every day | ORAL | 0 refills | Status: DC

## 2020-01-21 NOTE — Progress Notes (Signed)
Subjective:  Patient ID: Robert Richmond, male    DOB: [birth year]  Age: 34 y.o. MRN: [id number]  CC: Back Pain  This visit occurred during the SARS-CoV-2 public health emergency.  Safety protocols were in place, including screening questions prior to the visit, additional usage of staff PPE, and extensive cleaning of exam room while observing appropriate contact time as indicated for disinfecting solutions.    HPI [name] presents for concerns about left mid back pain and left shoulder pain.  He tells me that 3 days ago he picked up his dog and he had the acute onset of discomfort in his left thoracic region over the posterior rib cage.  He says the pain is gradually improving and does not radiate towards his extremities.  He also complains of left shoulder pain has been worsening over the last decade.  He denies any specific trauma or injury.  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  He says the shoulder pain causes mild decrease in range of motion and radiates into his left upper extremity.  He has occasionally had numbness in his left arm.  He denies pain in his cervical spine.  Outpatient Medications Prior to Visit  Medication Sig Dispense Refill  . ELDERBERRY PO Take by mouth.    . Ascorbic Acid (VITAMIN C PO) Take by mouth.    Marland Kitchen ibuprofen (ADVIL,MOTRIN) 200 MG tablet Take 200 mg by mouth every 6 (six) hours as needed.    . Multiple Vitamins-Minerals (VITAMIN D3 COMPLETE PO) Take by mouth.     No facility-administered medications prior to visit.    ROS Review of Systems  Constitutional: Negative for appetite change, diaphoresis, fatigue and unexpected weight change.  HENT: Negative for trouble swallowing.   Eyes: Negative for visual disturbance.  Respiratory: Negative for chest tightness, shortness of breath and wheezing.   Cardiovascular: Negative for chest pain, palpitations and leg swelling.    Gastrointestinal: Negative for abdominal pain, constipation, diarrhea, nausea and vomiting.  Endocrine: Negative.   Genitourinary: Negative.   Musculoskeletal: Positive for arthralgias and back pain. Negative for myalgias and neck pain.  Skin: Negative.  Negative for rash.  Neurological: Positive for numbness. Negative for dizziness, weakness and headaches.  Hematological: Negative for adenopathy. Does not bruise/bleed easily.  Psychiatric/Behavioral: Negative.     Objective:  BP 120/64 (BP Location: Left Arm, Patient Position: Sitting, Cuff Size: Normal)   Pulse 60   Temp 98.2 F (36.8 C) (Oral)   Resp 16   Ht 5\' 9"  (1.753 m)   Wt 180 lb 4 oz (81.8 kg)   SpO2 98%   BMI 26.62 kg/m   BP Readings from Last 3 Encounters:  01/21/20 120/64  04/09/19 126/76  09/23/18 116/78    Wt Readings from Last 3 Encounters:  01/21/20 180 lb 4 oz (81.8 kg)  04/09/19 180 lb 12 oz (82 kg)  09/23/18 173 lb (78.5 kg)    Physical Exam Constitutional:      Appearance: Normal appearance.  HENT:     Nose: Nose normal.     Mouth/Throat:     Mouth: Mucous membranes are moist.  Eyes:     General: No scleral icterus.    Conjunctiva/sclera: Conjunctivae normal.  Cardiovascular:     Rate and Rhythm: Normal rate and regular rhythm.     Heart sounds: No murmur heard.   Pulmonary:  Effort: Pulmonary effort is normal.     Breath sounds: No stridor. No wheezing, rhonchi or rales.  Abdominal:     General: Abdomen is flat.     Palpations: There is no mass.     Tenderness: There is no abdominal tenderness. There is no guarding.  Musculoskeletal:     Right shoulder: Normal.     Left shoulder: No swelling, deformity, tenderness or bony tenderness. Decreased range of motion. Normal strength.     Cervical back: Normal and neck supple.     Thoracic back: Normal. No swelling, edema, deformity, spasms, tenderness or bony tenderness. Normal range of motion.     Lumbar back: Normal. Normal range of  motion.  Lymphadenopathy:     Cervical: No cervical adenopathy.  Skin:    General: Skin is warm and dry.     Findings: No rash.  Neurological:     General: No focal deficit present.     Mental Status: He is alert and oriented to person, place, and time. Mental status is at baseline.     Cranial Nerves: Cranial nerves are intact.     Motor: Motor function is intact. No weakness or atrophy.     Coordination: Coordination is intact.     Gait: Gait is intact.     Deep Tendon Reflexes:     Reflex Scores:      Tricep reflexes are 1+ on the right side and 1+ on the left side.      Bicep reflexes are 1+ on the right side and 1+ on the left side.      Brachioradialis reflexes are 1+ on the right side and 1+ on the left side.      Patellar reflexes are 1+ on the right side and 1+ on the left side.      Achilles reflexes are 1+ on the right side and 1+ on the left side.    Lab Results  Component Value Date   WBC 10.1 09/23/2018   HGB 15.5 09/23/2018   HCT 45.9 09/23/2018   PLT 205.0 09/23/2018   GLUCOSE 90 04/09/2019   CHOL 129 10/12/2016   TRIG 56.0 10/12/2016   HDL 45.80 10/12/2016   LDLCALC 72 10/12/2016   ALT 19 09/23/2018   AST 18 09/23/2018   NA 139 04/09/2019   K 4.2 04/09/2019   CL 103 04/09/2019   CREATININE 1.01 04/09/2019   BUN 14 04/09/2019   CO2 32 04/09/2019   TSH 2.07 10/12/2016    DG Chest 2 View  Result Date: 08/14/2017 CLINICAL DATA:  Heart pounding, chest pain EXAM: CHEST - 2 VIEW COMPARISON:  None. FINDINGS: The heart size and mediastinal contours are within normal limits. Both lungs are clear. The visualized skeletal structures are unremarkable. IMPRESSION: No active cardiopulmonary disease. Electronically Signed   By: Jasmine Pang M.D.   On: 08/14/2017 23:03    Assessment & Plan:   [name] was seen today for back pain.  Diagnoses and all orders for this visit:  Impingement syndrome of left shoulder- He has symptoms of rotator cuff disorder with  impingement syndrome.  I recommended that he treat the pain with etodolac.  He will undergo an MRI to see if there is a lesion that would be amenable to surgery.  He also wants to see an orthopedic surgeon. -     etodolac (LODINE XL) 400 MG 24 hr tablet; Take 1 tablet (400 mg total) by mouth daily. -     MR Shoulder  Left Wo Contrast; Future -     Ambulatory referral to Orthopedic Surgery  Acute left-sided thoracic back pain- Symptoms and exam are consistent with musculoskeletal strain.  I recommended he treat this with the etodolac. -     etodolac (LODINE XL) 400 MG 24 hr tablet; Take 1 tablet (400 mg total) by mouth daily.   I have discontinued Ruford Vorhees's ibuprofen, Multiple Vitamins-Minerals (VITAMIN D3 COMPLETE PO), and Ascorbic Acid (VITAMIN C PO). I am also having him start on etodolac. Additionally, I am having him maintain his ELDERBERRY PO.  Meds ordered this encounter  Medications  . etodolac (LODINE XL) 400 MG 24 hr tablet    Sig: Take 1 tablet (400 mg total) by mouth daily.    Dispense:  90 each    Refill:  0     Follow-up: No follow-ups on file.  [name], MD

## 2020-01-22 ENCOUNTER — Encounter: Payer: Self-pay | Admitting: Internal Medicine

## 2020-01-22 NOTE — Patient Instructions (Signed)
Shoulder Pain Many things can cause shoulder pain, including:  An injury to the shoulder.  Overuse of the shoulder.  Arthritis. The source of the pain can be:  Inflammation.  An injury to the shoulder joint.  An injury to a tendon, ligament, or bone. Follow these instructions at home: Pay attention to changes in your symptoms. Let your health care provider know about them. Follow these instructions to relieve your pain. If you have a sling:  Wear the sling as told by your health care provider. Remove it only as told by your health care provider.  Loosen the sling if your fingers tingle, become numb, or turn cold and blue.  Keep the sling clean.  If the sling is not waterproof: ? Do not let it get wet. Remove it to shower or bathe.  Move your arm as little as possible, but keep your hand moving to prevent swelling. Managing pain, stiffness, and swelling   If directed, put ice on the painful area: ? Put ice in a plastic bag. ? Place a towel between your skin and the bag. ? Leave the ice on for 20 minutes, 2-3 times per day. Stop applying ice if it does not help with the pain.  Squeeze a soft ball or a foam pad as much as possible. This helps to keep the shoulder from swelling. It also helps to strengthen the arm. General instructions  Take over-the-counter and prescription medicines only as told by your health care provider.  Keep all follow-up visits as told by your health care provider. This is important. Contact a health care provider if:  Your pain gets worse.  Your pain is not relieved with medicines.  New pain develops in your arm, hand, or fingers. Get help right away if:  Your arm, hand, or fingers: ? Tingle. ? Become numb. ? Become swollen. ? Become painful. ? Turn white or blue. Summary  Shoulder pain can be caused by an injury, overuse, or arthritis.  Pay attention to changes in your symptoms. Let your health care provider know about  them.  This condition may be treated with a sling, ice, and pain medicines.  Contact your health care provider if the pain gets worse or new pain develops. Get help right away if your arm, hand, or fingers tingle or become numb, swollen, or painful.  Keep all follow-up visits as told by your health care provider. This is important. This information is not intended to replace advice given to you by your health care provider. Make sure you discuss any questions you have with your health care provider. Document Revised: 12/10/2017 Document Reviewed: 12/10/2017 Elsevier Patient Education  2020 Elsevier Inc.  

## 2020-01-26 ENCOUNTER — Ambulatory Visit: Payer: 59 | Admitting: Internal Medicine

## 2020-07-14 ENCOUNTER — Encounter: Payer: Self-pay | Admitting: Internal Medicine

## 2020-07-14 ENCOUNTER — Ambulatory Visit: Payer: 59 | Admitting: Internal Medicine

## 2020-07-14 ENCOUNTER — Other Ambulatory Visit: Payer: Self-pay

## 2020-07-14 VITALS — BP 122/78 | HR 76 | Temp 98.2°F | Ht 69.0 in | Wt 179.0 lb

## 2020-07-14 DIAGNOSIS — Z113 Encounter for screening for infections with a predominantly sexual mode of transmission: Secondary | ICD-10-CM

## 2020-07-14 DIAGNOSIS — Z0000 Encounter for general adult medical examination without abnormal findings: Secondary | ICD-10-CM | POA: Insufficient documentation

## 2020-07-14 DIAGNOSIS — F410 Panic disorder [episodic paroxysmal anxiety] without agoraphobia: Secondary | ICD-10-CM | POA: Diagnosis not present

## 2020-07-14 DIAGNOSIS — Z7252 High risk homosexual behavior: Secondary | ICD-10-CM

## 2020-07-14 LAB — TSH: TSH: 1.49 u[IU]/mL (ref 0.35–4.50)

## 2020-07-14 LAB — CBC WITH DIFFERENTIAL/PLATELET
Basophils Absolute: 0 10*3/uL (ref 0.0–0.1)
Basophils Relative: 0.3 % (ref 0.0–3.0)
Eosinophils Absolute: 0.1 10*3/uL (ref 0.0–0.7)
Eosinophils Relative: 0.8 % (ref 0.0–5.0)
HCT: 45.4 % (ref 39.0–52.0)
Hemoglobin: 15.3 g/dL (ref 13.0–17.0)
Lymphocytes Relative: 33.6 % (ref 12.0–46.0)
Lymphs Abs: 2.2 10*3/uL (ref 0.7–4.0)
MCHC: 33.8 g/dL (ref 30.0–36.0)
MCV: 84 fl (ref 78.0–100.0)
Monocytes Absolute: 0.5 10*3/uL (ref 0.1–1.0)
Monocytes Relative: 6.8 % (ref 3.0–12.0)
Neutro Abs: 3.9 10*3/uL (ref 1.4–7.7)
Neutrophils Relative %: 58.5 % (ref 43.0–77.0)
Platelets: 217 10*3/uL (ref 150.0–400.0)
RBC: 5.41 Mil/uL (ref 4.22–5.81)
RDW: 13.3 % (ref 11.5–15.5)
WBC: 6.6 10*3/uL (ref 4.0–10.5)

## 2020-07-14 LAB — BASIC METABOLIC PANEL
BUN: 15 mg/dL (ref 6–23)
CO2: 31 mEq/L (ref 19–32)
Calcium: 9.3 mg/dL (ref 8.4–10.5)
Chloride: 103 mEq/L (ref 96–112)
Creatinine, Ser: 1.3 mg/dL (ref 0.40–1.50)
GFR: 71.64 mL/min (ref >60.00)
Glucose, Bld: 81 mg/dL (ref 70–99)
Potassium: 4.2 mEq/L (ref 3.5–5.1)
Sodium: 138 mEq/L (ref 135–145)

## 2020-07-14 LAB — URINALYSIS, ROUTINE W REFLEX MICROSCOPIC
Bilirubin Urine: NEGATIVE
Hgb urine dipstick: NEGATIVE
Ketones, ur: NEGATIVE
Leukocytes,Ua: NEGATIVE
Nitrite: NEGATIVE
RBC / HPF: NONE SEEN (ref 0–?)
Specific Gravity, Urine: 1.02 (ref 1.000–1.030)
Total Protein, Urine: NEGATIVE
Urine Glucose: NEGATIVE
Urobilinogen, UA: 0.2 (ref 0.0–1.0)
WBC, UA: NONE SEEN (ref 0–?)
pH: 7 (ref 5.0–8.0)

## 2020-07-14 LAB — LIPID PANEL
Cholesterol: 152 mg/dL (ref 0–200)
HDL: 47.1 mg/dL (ref >39.00)
LDL Cholesterol: 80 mg/dL (ref 0–99)
NonHDL: 104.77
Total CHOL/HDL Ratio: 3
Triglycerides: 124 mg/dL (ref 0.0–149.0)
VLDL: 24.8 mg/dL (ref 0.0–40.0)

## 2020-07-14 MED ORDER — HYDROXYZINE HCL 10 MG PO TABS: 10.0000 mg | ORAL_TABLET | Freq: Three times a day (TID) | ORAL | 3 refills | Status: DC | PRN

## 2020-07-14 NOTE — Patient Instructions (Signed)
Safe Sex Practicing safe sex means taking steps before and during sex to reduce your risk of:  Getting an STI (sexually transmitted infection).  Giving your partner an STI.  Unwanted or unplanned pregnancy. How to practice safe sex Ways you can practice safe sex  Limit your sexual partners to only one partner who is having sex with only you.  Avoid using alcohol and drugs before having sex. Alcohol and drugs can affect your judgment.  Before having sex with a new partner: ? Talk to your partner about past partners, past STIs, and drug use. ? Get screened for STIs and discuss the results with your partner. Ask your partner to get screened too.  Check your body regularly for sores, blisters, rashes, or unusual discharge. If you notice any of these problems, visit your health care provider.  Avoid sexual contact if you have symptoms of an infection or you are being treated for an STI.  While having sex, use a condom. Make sure to: ? Use a condom every time you have vaginal, oral, or anal sex. Both females and males should wear condoms during oral sex. ? Keep condoms in place from the beginning to the end of sexual activity. ? Use a latex condom, if possible. Latex condoms offer the best protection. ? Use only water-based lubricants with a condom. Using petroleum-based lubricants or oils will weaken the condom and increase the chance that it will break.   Ways your health care provider can help you practice safe sex  See your health care provider for regular screenings, exams, and tests for STIs.  Talk with your health care provider about what kind of birth control (contraception) is best for you.  Get vaccinated against hepatitis B and human papillomavirus (HPV).  If you are at risk of being infected with HIV (human immunodeficiency virus), talk with your health care provider about taking a prescription medicine to prevent HIV infection. You are at risk for HIV if you: ? Are a man  who has sex with other men. ? Are sexually active with more than one partner. ? Take drugs by injection. ? Have a sex partner who has HIV. ? Have unprotected sex. ? Have sex with someone who has sex with both men and women. ? Have had an STI.   Follow these instructions at home:  Take over-the-counter and prescription medicines only as told by your health care provider.  Keep all follow-up visits. This is important. Where to find more information  Centers for Disease Control and Prevention: www.cdc.gov  Planned Parenthood: www.plannedparenthood.org  Office on Women's Health: www.womenshealth.gov Summary  Practicing safe sex means taking steps before and during sex to reduce your risk getting an STI, giving your partner an STI, and having an unwanted or unplanned pregnancy.  Before having sex with a new partner, talk to your partner about past partners, past STIs, and drug use.  Use a condom every time you have vaginal, oral, or anal sex. Both females and males should wear condoms during oral sex.  Check your body regularly for sores, blisters, rashes, or unusual discharge. If you notice any of these problems, visit your health care provider.  See your health care provider for regular screenings, exams, and tests for STIs. This information is not intended to replace advice given to you by your health care provider. Make sure you discuss any questions you have with your health care provider. Document Revised: 11/02/2019 Document Reviewed: 11/02/2019 Elsevier Patient Education  2021 Elsevier Inc.  

## 2020-07-14 NOTE — Progress Notes (Signed)
Subjective:  Patient ID: Robert Richmond, male    DOB: 05-Jun-1986  Age: 35 y.o. MRN: [id number]  CC: Annual Exam  This visit occurred during the SARS-CoV-2 public health emergency.  Safety protocols were in place, including screening questions prior to the visit, additional usage of staff PPE, and extensive cleaning of exam room while observing appropriate contact time as indicated for disinfecting solutions.    HPI Robert Richmond presents for a CPX.  He is in a long-term committed relationship but they are not monogamous.  He says they meet someone nearly weekly on a dating app and have sex with them.  They do not use condoms.  He has felt well recently and offers no complaints.  He would like to take PrEP to prevent HIV infection.   Outpatient Medications Prior to Visit  Medication Sig Dispense Refill  . ELDERBERRY PO Take by mouth.    . etodolac (LODINE XL) 400 MG 24 hr tablet Take 1 tablet (400 mg total) by mouth daily. 90 each 0   No facility-administered medications prior to visit.    ROS Review of Systems  Constitutional: Negative for chills, fatigue and fever.  HENT: Negative for sore throat and trouble swallowing.   Eyes: Negative.   Respiratory: Negative for cough, chest tightness, shortness of breath and wheezing.   Cardiovascular: Negative for chest pain, palpitations and leg swelling.  Gastrointestinal: Negative for abdominal pain, constipation, diarrhea, nausea and vomiting.  Endocrine: Negative.   Genitourinary: Negative.  Negative for difficulty urinating, dysuria, genital sores, hematuria, penile discharge, scrotal swelling, testicular pain and urgency.  Musculoskeletal: Negative.  Negative for arthralgias and myalgias.  Skin: Negative.  Negative for rash.  Neurological: Negative.  Negative for dizziness, weakness, light-headedness and headaches.  Hematological: Negative for adenopathy. Does not bruise/bleed easily.  Psychiatric/Behavioral: Negative for dysphoric mood,  sleep disturbance and suicidal ideas. The patient is nervous/anxious.     Objective:  BP 122/78   Pulse 76   Temp 98.2 F (36.8 C) (Oral)   Ht 5\' 9"  (1.753 m)   Wt 179 lb (81.2 kg)   SpO2 98%   BMI 26.43 kg/m   BP Readings from Last 3 Encounters:  07/14/20 122/78  01/21/20 120/64  04/09/19 126/76    Wt Readings from Last 3 Encounters:  07/14/20 179 lb (81.2 kg)  01/21/20 180 lb 4 oz (81.8 kg)  04/09/19 180 lb 12 oz (82 kg)    Physical Exam Vitals reviewed.  Constitutional:      Appearance: Normal appearance.  HENT:     Nose: Nose normal.     Mouth/Throat:     Mouth: Mucous membranes are moist.  Eyes:     General: No scleral icterus.    Conjunctiva/sclera: Conjunctivae normal.  Cardiovascular:     Rate and Rhythm: Normal rate and regular rhythm.     Heart sounds: No murmur heard.   Pulmonary:     Effort: Pulmonary effort is normal.     Breath sounds: No stridor. No wheezing, rhonchi or rales.  Abdominal:     General: Abdomen is flat.     Palpations: There is no mass.     Tenderness: There is no abdominal tenderness. There is no guarding.  Musculoskeletal:        General: Normal range of motion.     Cervical back: Neck supple.  Lymphadenopathy:     Cervical: No cervical adenopathy.  Skin:    General: Skin is warm and dry.     Findings:  No rash.  Neurological:     General: No focal deficit present.     Mental Status: He is alert.  Psychiatric:        Attention and Perception: Attention normal.        Mood and Affect: Mood is anxious. Affect is not labile or flat.        Behavior: Behavior normal.        Thought Content: Thought content normal. Thought content is not paranoid or delusional. Thought content does not include homicidal or suicidal ideation.        Cognition and Memory: Cognition normal.     Lab Results  Component Value Date   WBC 6.6 07/14/2020   HGB 15.3 07/14/2020   HCT 45.4 07/14/2020   PLT 217.0 07/14/2020   GLUCOSE 81  07/14/2020   CHOL 152 07/14/2020   TRIG 124.0 07/14/2020   HDL 47.10 07/14/2020   LDLCALC 80 07/14/2020   ALT 19 09/23/2018   AST 18 09/23/2018   NA 138 07/14/2020   K 4.2 07/14/2020   CL 103 07/14/2020   CREATININE 1.30 07/14/2020   BUN 15 07/14/2020   CO2 31 07/14/2020   TSH 1.49 07/14/2020    DG Chest 2 View  Result Date: 08/14/2017 CLINICAL DATA:  Heart pounding, chest pain EXAM: CHEST - 2 VIEW COMPARISON:  None. FINDINGS: The heart size and mediastinal contours are within normal limits. Both lungs are clear. The visualized skeletal structures are unremarkable. IMPRESSION: No active cardiopulmonary disease. Electronically Signed   By: Jasmine Pang M.D.   On: 08/14/2017 23:03    Assessment & Plan:   Tanmay was seen today for annual exam.  Diagnoses and all orders for this visit:  Routine general medical examination at a health care facility- Exam completed, labs reviewed, vaccines reviewed, pt ed material was given. -     HIV Antibody (routine testing w rflx); Future -     Hepatitis C antibody; Future -     Lipid panel; Future -     Lipid panel -     Hepatitis C antibody -     HIV Antibody (routine testing w rflx)  Routine screening for STI (sexually transmitted infection)- Screening is negative. -     Basic metabolic panel; Future -     Hepatitis B surface antibody,quantitative; Future -     Hepatitis B surface antigen; Future -     RPR; Future -     HIV Antibody (routine testing w rflx); Future -     Hepatitis C antibody; Future -     Urinalysis, Routine w reflex microscopic; Future -     Urinalysis, Routine w reflex microscopic -     Hepatitis C antibody -     HIV Antibody (routine testing w rflx) -     RPR -     Hepatitis B surface antigen -     Hepatitis B surface antibody,quantitative -     Basic metabolic panel  High risk homosexual behavior- He will start PrEP. -     CBC with Differential/Platelet; Future -     Hepatitis B surface antibody,quantitative;  Future -     Hepatitis B surface antigen; Future -     RPR; Future -     HIV Antibody (routine testing w rflx); Future -     Hepatitis C antibody; Future -     Hepatitis C antibody -     HIV Antibody (routine testing w rflx) -  RPR -     Hepatitis B surface antigen -     Hepatitis B surface antibody,quantitative -     CBC with Differential/Platelet -     emtricitabine-tenofovir (TRUVADA) 200-300 MG tablet; Take 1 tablet by mouth daily.  Panic anxiety syndrome -     hydrOXYzine (ATARAX/VISTARIL) 10 MG tablet; Take 1 tablet (10 mg total) by mouth every 8 (eight) hours as needed. -     TSH; Future -     TSH   I have discontinued Kaylan Pettet's etodolac. I am also having him start on hydrOXYzine and emtricitabine-tenofovir. Additionally, I am having him maintain his ELDERBERRY PO.  Meds ordered this encounter  Medications  . hydrOXYzine (ATARAX/VISTARIL) 10 MG tablet    Sig: Take 1 tablet (10 mg total) by mouth every 8 (eight) hours as needed.    Dispense:  90 tablet    Refill:  3  . emtricitabine-tenofovir (TRUVADA) 200-300 MG tablet    Sig: Take 1 tablet by mouth daily.    Dispense:  90 tablet    Refill:  1     Follow-up: Return in about 3 months (around 10/11/2020).  [name], MD

## 2020-07-15 LAB — HEPATITIS C ANTIBODY
Hepatitis C Ab: NONREACTIVE
SIGNAL TO CUT-OFF: 0.02 (ref <1.00)

## 2020-07-15 LAB — HEPATITIS B SURFACE ANTIGEN: Hepatitis B Surface Ag: NONREACTIVE

## 2020-07-15 LAB — RPR: RPR Ser Ql: NONREACTIVE

## 2020-07-15 LAB — HEPATITIS B SURFACE ANTIBODY, QUANTITATIVE: Hep B S AB Quant (Post): <5 m[IU]/mL — ABNORMAL LOW (ref >=10)

## 2020-07-15 LAB — HIV ANTIBODY (ROUTINE TESTING W REFLEX): HIV 1&2 Ab, 4th Generation: NONREACTIVE

## 2020-07-15 MED ORDER — EMTRICITABINE-TENOFOVIR DF 200-300 MG PO TABS: 1.0000 | ORAL_TABLET | Freq: Every day | ORAL | 1 refills | Status: DC

## 2020-07-20 ENCOUNTER — Ambulatory Visit: Payer: 59 | Admitting: Internal Medicine

## 2020-08-24 ENCOUNTER — Ambulatory Visit: Payer: 59 | Admitting: Internal Medicine

## 2020-10-21 ENCOUNTER — Other Ambulatory Visit: Payer: Self-pay

## 2020-10-21 ENCOUNTER — Ambulatory Visit: Payer: Self-pay | Admitting: Physician Assistant

## 2020-10-21 ENCOUNTER — Encounter: Payer: Self-pay | Admitting: Physician Assistant

## 2020-10-21 DIAGNOSIS — Z113 Encounter for screening for infections with a predominantly sexual mode of transmission: Secondary | ICD-10-CM

## 2020-10-21 DIAGNOSIS — A5601 Chlamydial cystitis and urethritis: Secondary | ICD-10-CM

## 2020-10-21 MED ORDER — DOXYCYCLINE HYCLATE 100 MG PO TABS: 100.0000 mg | ORAL_TABLET | Freq: Two times a day (BID) | ORAL | 0 refills | Status: AC

## 2020-10-21 NOTE — Progress Notes (Signed)
   [facility] STI clinic/screening visit  Subjective:  Robert Richmond is a 35 y.o. male being seen today for an STI screening visit. The patient reports they do have symptoms.    Patient has the following medical conditions:   Patient Active Problem List   Diagnosis Date Noted  . Routine general medical examination at a health care facility 07/14/2020  . Routine screening for STI (sexually transmitted infection) 07/14/2020  . Hepatitis B core antibody positive 09/24/2018  . High risk homosexual behavior 09/23/2018  . Seasonal allergies   . Generalized anxiety disorder 05/28/2014  . Gastroesophageal reflux disease without esophagitis 05/28/2014     Chief Complaint  Patient presents with  . SEXUALLY TRANSMITTED DISEASE    screening    HPI  Patient reports that he was tested in [place] about 1 week ago and then notified that his test for Chlamydia was positive.  States that he has also had dysuria, fever and nausea.  Denies chronic conditions and surgeries.  States that he takes PrEP and is followed regularly to continue with this therapy.  Reports last HIV test was in 07/2020.   See flowsheet for further details and programmatic requirements.    The following portions of the patient's history were reviewed and updated as appropriate: allergies, current medications, past medical history, past social history, past surgical history and problem list.  Objective:  There were no vitals filed for this visit.  Physical Exam Constitutional:      General: He is not in acute distress.    Appearance: Normal appearance.  HENT:     Head: Normocephalic and atraumatic.  Eyes:     Conjunctiva/sclera: Conjunctivae normal.  Pulmonary:     Effort: Pulmonary effort is normal.  Skin:    General: Skin is warm and dry.  Neurological:     Mental Status: He is alert and oriented to person, place, and time.  Psychiatric:        Mood and Affect: Mood normal.         Behavior: Behavior normal.        Thought Content: Thought content normal.        Judgment: Judgment normal.       Assessment and Plan:  Deundre Thong is a 35 y.o. male presenting to the [facility] for STI screening  1. Screening for STD (sexually transmitted disease) Patient into clinic with symptoms. Patient declines re-screening today and requests treatment only.  Rec condoms with all sex.   2. Chlamydial urethritis in male Will treat for Chlamydia with Doxycycline 100 mg #14 1 po BID for 7 days. No sex for 14 days and until after partner/s complete treatment. Call with questions or concerns. - doxycycline (VIBRA-TABS) 100 MG tablet; Take 1 tablet (100 mg total) by mouth 2 (two) times daily for 7 days.  Dispense: 14 tablet; Refill: 0     No follow-ups on file.  No future appointments.  Matt Holmes, PA

## 2020-10-27 NOTE — Progress Notes (Signed)
Chart reviewed by Pharmacist  Suzanne Walker PharmD, Contract Pharmacist at [facility]  

## 2020-11-29 ENCOUNTER — Other Ambulatory Visit: Payer: Self-pay

## 2020-11-29 ENCOUNTER — Ambulatory Visit: Payer: 59 | Admitting: Internal Medicine

## 2020-11-29 ENCOUNTER — Encounter: Payer: Self-pay | Admitting: Internal Medicine

## 2020-11-29 VITALS — BP 116/78 | HR 62 | Temp 98.3°F | Resp 16 | Ht 69.0 in | Wt 175.0 lb

## 2020-11-29 DIAGNOSIS — Z113 Encounter for screening for infections with a predominantly sexual mode of transmission: Secondary | ICD-10-CM | POA: Diagnosis not present

## 2020-11-29 DIAGNOSIS — J029 Acute pharyngitis, unspecified: Secondary | ICD-10-CM

## 2020-11-29 DIAGNOSIS — J028 Acute pharyngitis due to other specified organisms: Secondary | ICD-10-CM | POA: Diagnosis not present

## 2020-11-29 DIAGNOSIS — Z7252 High risk homosexual behavior: Secondary | ICD-10-CM

## 2020-11-29 LAB — BASIC METABOLIC PANEL
BUN: 16 mg/dL (ref 6–23)
CO2: 30 mEq/L (ref 19–32)
Calcium: 9.2 mg/dL (ref 8.4–10.5)
Chloride: 103 mEq/L (ref 96–112)
Creatinine, Ser: 1.06 mg/dL (ref 0.40–1.50)
GFR: 91.28 mL/min (ref >60.00)
Glucose, Bld: 97 mg/dL (ref 70–99)
Potassium: 4.5 mEq/L (ref 3.5–5.1)
Sodium: 138 mEq/L (ref 135–145)

## 2020-11-29 LAB — CBC WITH DIFFERENTIAL/PLATELET
Basophils Absolute: 0 10*3/uL (ref 0.0–0.1)
Basophils Relative: 0.3 % (ref 0.0–3.0)
Eosinophils Absolute: 0.1 10*3/uL (ref 0.0–0.7)
Eosinophils Relative: 1.1 % (ref 0.0–5.0)
HCT: 47.4 % (ref 39.0–52.0)
Hemoglobin: 16.1 g/dL (ref 13.0–17.0)
Lymphocytes Relative: 31.6 % (ref 12.0–46.0)
Lymphs Abs: 1.9 10*3/uL (ref 0.7–4.0)
MCHC: 34 g/dL (ref 30.0–36.0)
MCV: 86 fl (ref 78.0–100.0)
Monocytes Absolute: 0.4 10*3/uL (ref 0.1–1.0)
Monocytes Relative: 6.3 % (ref 3.0–12.0)
Neutro Abs: 3.7 10*3/uL (ref 1.4–7.7)
Neutrophils Relative %: 60.7 % (ref 43.0–77.0)
Platelets: 193 10*3/uL (ref 150.0–400.0)
RBC: 5.51 Mil/uL (ref 4.22–5.81)
RDW: 13.6 % (ref 11.5–15.5)
WBC: 6.2 10*3/uL (ref 4.0–10.5)

## 2020-11-29 LAB — POCT RAPID STREP A (OFFICE): Rapid Strep A Screen: NEGATIVE

## 2020-11-29 MED ORDER — AZITHROMYCIN 500 MG PO TABS: 500.0000 mg | ORAL_TABLET | Freq: Every day | ORAL | 0 refills | Status: AC

## 2020-11-29 NOTE — Patient Instructions (Signed)
Sore Throat A sore throat is pain, burning, irritation, or scratchiness in the throat. When you have a sore throat, you may feel pain or tenderness in your throat when youswallow or talk. Many things can cause a sore throat, including: An infection. Seasonal allergies. Dryness in the air. Irritants, such as smoke or pollution. Radiation treatment to the area. Gastroesophageal reflux disease (GERD). A tumor. A sore throat is often the first sign of another sickness. It may happen with other symptoms, such as coughing, sneezing, fever, and swollen neck glands.Most sore throats go away without medical treatment. Follow these instructions at home:     Take over-the-counter medicines only as told by your health care provider. If your child has a sore throat, do not give your child aspirin because of the association with Reye syndrome. Drink enough fluids to keep your urine pale yellow. Rest as needed. To help with pain, try: Sipping warm liquids, such as broth, herbal tea, or warm water. Eating or drinking cold or frozen liquids, such as frozen ice pops. Gargling with a salt-water mixture 3-4 times a day or as needed. To make a salt-water mixture, completely dissolve -1 tsp (3-6 g) of salt in 1 cup (237 mL) of warm water. Sucking on hard candy or throat lozenges. Putting a cool-mist humidifier in your bedroom at night to moisten the air. Sitting in the bathroom with the door closed for 5-10 minutes while you run hot water in the shower. Do not use any products that contain nicotine or tobacco, such as cigarettes, e-cigarettes, and chewing tobacco. If you need help quitting, ask your health care provider. Wash your hands well and often with soap and water. If soap and water are not available, use hand sanitizer. Contact a health care provider if: You have a fever for more than 2-3 days. You have symptoms that last (are persistent) for more than 2-3 days. Your throat does not get better  within 7 days. You have a fever and your symptoms suddenly get worse. Your child who is 3 months to 3 years old has a temperature of 102.2F (39C) or higher. Get help right away if: You have difficulty breathing. You cannot swallow fluids, soft foods, or your saliva. You have increased swelling in your throat or neck. You have persistent nausea and vomiting. Summary A sore throat is pain, burning, irritation, or scratchiness in the throat. Many things can cause a sore throat. Take over-the-counter medicines only as told by your health care provider. Do not give your child aspirin. Drink plenty of fluids, and rest as needed. Contact a health care provider if your symptoms worsen or your sore throat does not get better within 7 days. This information is not intended to replace advice given to you by your health care provider. Make sure you discuss any questions you have with your healthcare provider. Document Revised: 10/28/2017 Document Reviewed: 10/28/2017 Elsevier Patient Education  2022 [employer] Inc.  

## 2020-11-30 LAB — RPR: RPR Ser Ql: NONREACTIVE

## 2020-11-30 LAB — HEPATITIS B SURFACE ANTIGEN: Hepatitis B Surface Ag: NONREACTIVE

## 2020-11-30 LAB — HIV ANTIBODY (ROUTINE TESTING W REFLEX): HIV 1&2 Ab, 4th Generation: NONREACTIVE

## 2020-12-02 NOTE — Progress Notes (Signed)
Subjective:  Patient ID: Robert Richmond, male    DOB: [birth year]  Age: 35 y.o. MRN: [id number]  CC: Sore Throat  This visit occurred during the SARS-CoV-2 public health emergency.  Safety protocols were in place, including screening questions prior to the visit, additional usage of staff PPE, and extensive cleaning of exam room while observing appropriate contact time as indicated for disinfecting solutions.    HPI Robert Richmond presents for f/up -  He complains of 1 week history of sore throat.  He denies fever, chills, lymphadenopathy, odynophagia, or dysphagia.  He continues to consider his sexual practices high risk and would like to continue taking PrEP.  Outpatient Medications Prior to Visit  Medication Sig Dispense Refill   ELDERBERRY PO Take by mouth.     emtricitabine-tenofovir (TRUVADA) 200-300 MG tablet Take 1 tablet by mouth daily. 90 tablet 1   hydrOXYzine (ATARAX/VISTARIL) 10 MG tablet Take 1 tablet (10 mg total) by mouth every 8 (eight) hours as needed. 90 tablet 3   No facility-administered medications prior to visit.    ROS Review of Systems  Constitutional:  Negative for chills, fatigue and fever.  HENT:  Positive for sore throat. Negative for facial swelling and trouble swallowing.   Eyes: Negative.   Respiratory:  Negative for cough and shortness of breath.   Cardiovascular:  Negative for chest pain, palpitations and leg swelling.  Gastrointestinal:  Negative for abdominal pain, diarrhea, nausea and vomiting.  Endocrine: Negative.   Genitourinary: Negative.  Negative for difficulty urinating, dysuria, genital sores, hematuria and testicular pain.  Musculoskeletal:  Negative for arthralgias.  Skin: Negative.  Negative for color change and rash.  Neurological: Negative.  Negative for dizziness, weakness, light-headedness and headaches.  Hematological:  Negative for adenopathy. Does not bruise/bleed easily.  Psychiatric/Behavioral: Negative.     Objective:  BP 116/78  (BP Location: Left Arm, Patient Position: Sitting, Cuff Size: Large)   Pulse 62   Temp 98.3 F (36.8 C) (Oral)   Ht 5\' 9"  (1.753 m)   Wt 175 lb (79.4 kg)   SpO2 98%   BMI 25.84 kg/m   BP Readings from Last 3 Encounters:  11/29/20 116/78  07/14/20 122/78  01/21/20 120/64    Wt Readings from Last 3 Encounters:  11/29/20 175 lb (79.4 kg)  07/14/20 179 lb (81.2 kg)  01/21/20 180 lb 4 oz (81.8 kg)    Physical Exam Vitals reviewed.  Constitutional:      Appearance: Normal appearance.  HENT:     Nose: Nose normal.     Mouth/Throat:     Lips: Pink.     Mouth: Mucous membranes are moist.     Palate: No lesions.     Pharynx: Oropharynx is clear. No pharyngeal swelling, posterior oropharyngeal erythema or uvula swelling.     Tonsils: No tonsillar exudate.  Eyes:     General: No scleral icterus.    Conjunctiva/sclera: Conjunctivae normal.  Cardiovascular:     Rate and Rhythm: Normal rate and regular rhythm.     Heart sounds: No murmur heard. Pulmonary:     Effort: Pulmonary effort is normal.     Breath sounds: No stridor. No wheezing, rhonchi or rales.  Abdominal:     General: Abdomen is flat.     Palpations: There is no mass.     Tenderness: There is no abdominal tenderness.  Musculoskeletal:        General: Normal range of motion.     Cervical back: Neck supple. No  tenderness.     Right lower leg: No edema.     Left lower leg: No edema.  Lymphadenopathy:     Cervical: No cervical adenopathy.  Skin:    General: Skin is warm and dry.     Findings: No rash.  Neurological:     General: No focal deficit present.     Mental Status: He is alert.    Lab Results  Component Value Date   WBC 6.2 11/29/2020   HGB 16.1 11/29/2020   HCT 47.4 11/29/2020   PLT 193.0 11/29/2020   GLUCOSE 97 11/29/2020   CHOL 152 07/14/2020   TRIG 124.0 07/14/2020   HDL 47.10 07/14/2020   LDLCALC 80 07/14/2020   ALT 19 09/23/2018   AST 18 09/23/2018   NA 138 11/29/2020   K 4.5  11/29/2020   CL 103 11/29/2020   CREATININE 1.06 11/29/2020   BUN 16 11/29/2020   CO2 30 11/29/2020   TSH 1.49 07/14/2020    DG Chest 2 View  Result Date: 08/14/2017 CLINICAL DATA:  Heart pounding, chest pain EXAM: CHEST - 2 VIEW COMPARISON:  None. FINDINGS: The heart size and mediastinal contours are within normal limits. Both lungs are clear. The visualized skeletal structures are unremarkable. IMPRESSION: No active cardiopulmonary disease. Electronically Signed   By: Jasmine Pang M.D.   On: 08/14/2017 23:03    Assessment & Plan:   Robert Richmond was seen today for sore throat.  Diagnoses and all orders for this visit:  Sore throat- RSS is negative. -     POCT rapid strep A -     RPR; Future -     RPR  Acute pharyngitis due to other specified organisms- Will empirically treat for bacterial causes with azithromycin. -     CBC with Differential/Platelet; Future -     RPR; Future -     azithromycin (ZITHROMAX) 500 MG tablet; Take 1 tablet (500 mg total) by mouth daily for 3 days. -     RPR -     CBC with Differential/Platelet  Routine screening for STI (sexually transmitted infection)  High risk homosexual behavior- Screening for HIV and hep B is negative.  He can continue taking PrEP. -     Basic metabolic panel; Future -     RPR; Future -     Hepatitis B surface antigen; Future -     HIV Antibody (routine testing w rflx); Future -     HIV Antibody (routine testing w rflx) -     Hepatitis B surface antigen -     RPR -     Basic metabolic panel  I am having Robert Richmond start on azithromycin. I am also having him maintain his ELDERBERRY PO, hydrOXYzine, and emtricitabine-tenofovir.  Meds ordered this encounter  Medications   azithromycin (ZITHROMAX) 500 MG tablet    Sig: Take 1 tablet (500 mg total) by mouth daily for 3 days.    Dispense:  3 tablet    Refill:  0     Follow-up: Return if symptoms worsen or fail to improve.  [name], MD

## 2020-12-21 ENCOUNTER — Ambulatory Visit: Payer: 59 | Admitting: Internal Medicine

## 2021-04-17 ENCOUNTER — Other Ambulatory Visit: Payer: Self-pay | Admitting: Internal Medicine

## 2021-04-17 DIAGNOSIS — Z7252 High risk homosexual behavior: Secondary | ICD-10-CM

## 2021-04-18 ENCOUNTER — Other Ambulatory Visit: Payer: Self-pay | Admitting: Internal Medicine

## 2021-04-18 DIAGNOSIS — Z7252 High risk homosexual behavior: Secondary | ICD-10-CM

## 2021-04-24 ENCOUNTER — Other Ambulatory Visit: Payer: Self-pay

## 2021-04-24 ENCOUNTER — Ambulatory Visit: Payer: 59 | Admitting: Internal Medicine

## 2021-04-24 ENCOUNTER — Encounter: Payer: Self-pay | Admitting: Internal Medicine

## 2021-04-24 VITALS — BP 106/64 | HR 74 | Temp 98.1°F | Ht 69.0 in | Wt 177.0 lb

## 2021-04-24 DIAGNOSIS — Z23 Encounter for immunization: Secondary | ICD-10-CM

## 2021-04-24 DIAGNOSIS — Z113 Encounter for screening for infections with a predominantly sexual mode of transmission: Secondary | ICD-10-CM | POA: Diagnosis not present

## 2021-04-24 DIAGNOSIS — Z7252 High risk homosexual behavior: Secondary | ICD-10-CM

## 2021-04-24 DIAGNOSIS — R7989 Other specified abnormal findings of blood chemistry: Secondary | ICD-10-CM

## 2021-04-24 DIAGNOSIS — R768 Other specified abnormal immunological findings in serum: Secondary | ICD-10-CM

## 2021-04-24 LAB — HEPATIC FUNCTION PANEL
ALT: 57 U/L — ABNORMAL HIGH (ref 0–53)
AST: 42 U/L — ABNORMAL HIGH (ref 0–37)
Albumin: 4.5 g/dL (ref 3.5–5.2)
Alkaline Phosphatase: 89 U/L (ref 39–117)
Bilirubin, Direct: 0.2 mg/dL (ref 0.0–0.3)
Total Bilirubin: 0.9 mg/dL (ref 0.2–1.2)
Total Protein: 6.8 g/dL (ref 6.0–8.3)

## 2021-04-24 NOTE — Progress Notes (Signed)
Subjective:  Patient ID: Robert Richmond, male    DOB: 02/26/1986  Age: 35 y.o. MRN: [id number]  CC: Follow-up  This visit occurred during the SARS-CoV-2 public health emergency.  Safety protocols were in place, including screening questions prior to the visit, additional usage of staff PPE, and extensive cleaning of exam room while observing appropriate contact time as indicated for disinfecting solutions.    HPI [name] presents for f/up -  He wants to continue taking PrEP. He has felt recently but has taken motrin 800 mg a few times recently.  Outpatient Medications Prior to Visit  Medication Sig Dispense Refill   ELDERBERRY PO Take by mouth.     emtricitabine-tenofovir (TRUVADA) 200-300 MG tablet Take 1 tablet by mouth daily. 90 tablet 1   hydrOXYzine (ATARAX/VISTARIL) 10 MG tablet Take 1 tablet (10 mg total) by mouth every 8 (eight) hours as needed. 90 tablet 3   No facility-administered medications prior to visit.    ROS Review of Systems  Constitutional:  Negative for chills, diaphoresis, fatigue and fever.  HENT: Negative.    Eyes: Negative.   Respiratory:  Negative for cough, chest tightness, shortness of breath and wheezing.   Cardiovascular:  Negative for chest pain, palpitations and leg swelling.  Gastrointestinal:  Negative for abdominal pain, constipation, diarrhea, nausea and vomiting.  Endocrine: Negative.   Genitourinary: Negative.  Negative for difficulty urinating, dysuria, genital sores, penile discharge, scrotal swelling, testicular pain and urgency.  Musculoskeletal: Negative.   Skin: Negative.   Neurological: Negative.  Negative for dizziness, weakness and light-headedness.  Hematological:  Negative for adenopathy. Does not bruise/bleed easily.   Objective:  BP 106/64 (BP Location: Right Arm, Patient Position: Sitting, Cuff Size: Large)   Pulse 74   Temp 98.1 F (36.7 C) (Oral)   Ht 5\' 9"  (1.753 m)   Wt 177 lb (80.3 kg)   SpO2 98%   BMI 26.14 kg/m    BP Readings from Last 3 Encounters:  04/24/21 106/64  11/29/20 116/78  07/14/20 122/78    Wt Readings from Last 3 Encounters:  04/24/21 177 lb (80.3 kg)  11/29/20 175 lb (79.4 kg)  07/14/20 179 lb (81.2 kg)    Physical Exam Vitals reviewed.  HENT:     Nose: Nose normal.     Mouth/Throat:     Mouth: Mucous membranes are moist.  Eyes:     General: No scleral icterus.    Conjunctiva/sclera: Conjunctivae normal.  Cardiovascular:     Rate and Rhythm: Normal rate and regular rhythm.     Heart sounds: No murmur heard. Pulmonary:     Effort: Pulmonary effort is normal.     Breath sounds: No stridor. No wheezing, rhonchi or rales.  Abdominal:     General: Abdomen is flat.     Palpations: There is no mass.     Tenderness: There is no abdominal tenderness. There is no guarding or rebound.     Hernia: No hernia is present.  Musculoskeletal:        General: Normal range of motion.     Cervical back: Neck supple.     Right lower leg: No edema.     Left lower leg: No edema.  Lymphadenopathy:     Cervical: No cervical adenopathy.  Skin:    General: Skin is warm and dry.     Findings: No rash.  Neurological:     General: No focal deficit present.     Mental Status: He is alert.  Psychiatric:        Mood and Affect: Mood normal.        Behavior: Behavior normal.    Lab Results  Component Value Date   WBC 6.2 11/29/2020   HGB 16.1 11/29/2020   HCT 47.4 11/29/2020   PLT 193.0 11/29/2020   GLUCOSE 97 11/29/2020   CHOL 152 07/14/2020   TRIG 124.0 07/14/2020   HDL 47.10 07/14/2020   LDLCALC 80 07/14/2020   ALT 57 (H) 04/24/2021   AST 42 (H) 04/24/2021   NA 138 11/29/2020   K 4.5 11/29/2020   CL 103 11/29/2020   CREATININE 1.06 11/29/2020   BUN 16 11/29/2020   CO2 30 11/29/2020   TSH 1.49 07/14/2020    DG Chest 2 View  Result Date: 08/14/2017 CLINICAL DATA:  Heart pounding, chest pain EXAM: CHEST - 2 VIEW COMPARISON:  None. FINDINGS: The heart size and mediastinal  contours are within normal limits. Both lungs are clear. The visualized skeletal structures are unremarkable. IMPRESSION: No active cardiopulmonary disease. Electronically Signed   By: Jasmine Pang M.D.   On: 08/14/2017 23:03    Assessment & Plan:   [name] was seen today for follow-up.  Diagnoses and all orders for this visit:  High risk homosexual behavior -     Hepatitis B surface antigen; Future -     HIV Antibody (routine testing w rflx); Future -     RPR; Future -     RPR -     HIV Antibody (routine testing w rflx) -     Hepatitis B surface antigen  Routine screening for STI (sexually transmitted infection) -     Hepatitis B surface antigen; Future -     HIV Antibody (routine testing w rflx); Future -     RPR; Future -     RPR -     HIV Antibody (routine testing w rflx) -     Hepatitis B surface antigen  Hepatitis B core antibody positive- Hep B antigen is negative. -     Hepatic function panel; Future -     Hepatic function panel  Elevated LFTs- This does not loke like viral hepatitis. Will hold truvada and motrin for a few weeks and will recheck.  Other orders -     Flu Vaccine QUAD 6+ mos PF IM (Fluarix Quad PF) -     Hepatitis A vaccine adult IM  I am having [name] maintain his ELDERBERRY PO, hydrOXYzine, and emtricitabine-tenofovir.  No orders of the defined types were placed in this encounter.    Follow-up: Return in about 3 months (around 07/25/2021).  [name], MD

## 2021-04-24 NOTE — Patient Instructions (Signed)
Safe Sex Practicing safe sex means taking steps before and during sex to reduce your risk of: Getting an STI (sexually transmitted infection). Giving your partner an STI. Unwanted or unplanned pregnancy. How to practice safe sex Ways you can practice safe sex  Limit your sexual partners to only one partner who is having sex with only you. Avoid using alcohol and drugs before having sex. Alcohol and drugs can affect your judgment. Before having sex with a new partner: Talk to your partner about past partners, past STIs, and drug use. Get screened for STIs and discuss the results with your partner. Ask your partner to get screened too. Check your body regularly for sores, blisters, rashes, or unusual discharge. If you notice any of these problems, visit your health care provider. Avoid sexual contact if you have symptoms of an infection or you are being treated for an STI. While having sex, use a condom. Make sure to: Use a condom every time you have vaginal, oral, or anal sex. Both females and males should wear condoms during oral sex. Keep condoms in place from the beginning to the end of sexual activity. Use a latex condom, if possible. Latex condoms offer the best protection. Use only water-based lubricants with a condom. Using petroleum-based lubricants or oils will weaken the condom and increase the chance that it will break. Ways your health care provider can help you practice safe sex  See your health care provider for regular screenings, exams, and tests for STIs. Talk with your health care provider about what kind of birth control (contraception) is best for you. Get vaccinated against hepatitis B and human papillomavirus (HPV). If you are at risk of being infected with HIV (human immunodeficiency virus), talk with your health care provider about taking a prescription medicine to prevent HIV infection. You are at risk for HIV if you: Are a man who has sex with other men. Are  sexually active with more than one partner. Take drugs by injection. Have a sex partner who has HIV. Have unprotected sex. Have sex with someone who has sex with both men and women. Have had an STI. Follow these instructions at home: Take over-the-counter and prescription medicines only as told by your health care provider. Keep all follow-up visits. This is important. Where to find more information Centers for Disease Control and Prevention: www.cdc.gov Planned Parenthood: www.plannedparenthood.org Office on Women's Health: www.womenshealth.gov Summary Practicing safe sex means taking steps before and during sex to reduce your risk getting an STI, giving your partner an STI, and having an unwanted or unplanned pregnancy. Before having sex with a new partner, talk to your partner about past partners, past STIs, and drug use. Use a condom every time you have vaginal, oral, or anal sex. Both females and males should wear condoms during oral sex. Check your body regularly for sores, blisters, rashes, or unusual discharge. If you notice any of these problems, visit your health care provider. See your health care provider for regular screenings, exams, and tests for STIs. This information is not intended to replace advice given to you by your health care provider. Make sure you discuss any questions you have with your health care provider. Document Revised: 11/02/2019 Document Reviewed: 11/02/2019 Elsevier Patient Education  2022 Elsevier Inc.  

## 2021-04-25 ENCOUNTER — Encounter: Payer: Self-pay | Admitting: Internal Medicine

## 2021-04-25 LAB — HIV ANTIBODY (ROUTINE TESTING W REFLEX): HIV 1&2 Ab, 4th Generation: NONREACTIVE

## 2021-04-25 LAB — RPR: RPR Ser Ql: NONREACTIVE

## 2021-04-25 LAB — HEPATITIS B SURFACE ANTIGEN: Hepatitis B Surface Ag: NONREACTIVE

## 2021-04-29 DIAGNOSIS — R7989 Other specified abnormal findings of blood chemistry: Secondary | ICD-10-CM | POA: Insufficient documentation

## 2021-05-22 ENCOUNTER — Other Ambulatory Visit: Payer: Self-pay

## 2021-05-22 ENCOUNTER — Other Ambulatory Visit: Payer: Self-pay | Admitting: Internal Medicine

## 2021-05-22 ENCOUNTER — Other Ambulatory Visit (INDEPENDENT_AMBULATORY_CARE_PROVIDER_SITE_OTHER): Payer: 59

## 2021-05-22 DIAGNOSIS — R7989 Other specified abnormal findings of blood chemistry: Secondary | ICD-10-CM

## 2021-05-22 LAB — HEPATIC FUNCTION PANEL
ALT: 22 U/L (ref 0–53)
AST: 18 U/L (ref 0–37)
Albumin: 4.4 g/dL (ref 3.5–5.2)
Alkaline Phosphatase: 78 U/L (ref 39–117)
Bilirubin, Direct: 0.1 mg/dL (ref 0.0–0.3)
Total Bilirubin: 0.6 mg/dL (ref 0.2–1.2)
Total Protein: 6.8 g/dL (ref 6.0–8.3)

## 2021-05-25 ENCOUNTER — Other Ambulatory Visit: Payer: Self-pay | Admitting: Internal Medicine

## 2021-05-25 DIAGNOSIS — Z7252 High risk homosexual behavior: Secondary | ICD-10-CM

## 2021-06-02 ENCOUNTER — Encounter: Payer: Self-pay | Admitting: Internal Medicine

## 2021-06-02 ENCOUNTER — Other Ambulatory Visit: Payer: Self-pay

## 2021-06-02 ENCOUNTER — Ambulatory Visit (INDEPENDENT_AMBULATORY_CARE_PROVIDER_SITE_OTHER): Payer: 59 | Admitting: Internal Medicine

## 2021-06-02 ENCOUNTER — Other Ambulatory Visit: Payer: Self-pay | Admitting: Internal Medicine

## 2021-06-02 VITALS — BP 108/78 | HR 72 | Temp 98.8°F | Resp 16 | Wt 177.0 lb

## 2021-06-02 DIAGNOSIS — U071 COVID-19: Secondary | ICD-10-CM | POA: Diagnosis not present

## 2021-06-02 DIAGNOSIS — J029 Acute pharyngitis, unspecified: Secondary | ICD-10-CM | POA: Diagnosis not present

## 2021-06-02 LAB — POCT RAPID STREP A (OFFICE): Rapid Strep A Screen: NEGATIVE

## 2021-06-02 LAB — POC COVID19 BINAXNOW: SARS Coronavirus 2 Ag: POSITIVE — AB

## 2021-06-02 MED ORDER — NIRMATRELVIR/RITONAVIR (PAXLOVID)TABLET: 3.0000 | ORAL_TABLET | Freq: Two times a day (BID) | ORAL | 0 refills | Status: AC

## 2021-06-02 MED ORDER — NIRMATRELVIR/RITONAVIR (PAXLOVID)TABLET: 3.0000 | ORAL_TABLET | Freq: Two times a day (BID) | ORAL | 0 refills | Status: DC

## 2021-06-02 MED ORDER — PROMETHAZINE HCL 12.5 MG PO TABS
12.5000 mg | ORAL_TABLET | Freq: Four times a day (QID) | ORAL | 0 refills | Status: DC | PRN
Start: 2021-06-02 — End: 2021-11-17

## 2021-06-02 NOTE — Patient Instructions (Signed)
10 Things You Can Do to Manage Your COVID-19 Symptoms at Home ?If you have possible or confirmed COVID-19 ?Stay home except to get medical care. ?Monitor your symptoms carefully. If your symptoms get worse, call your healthcare provider immediately. ?Get rest and stay hydrated. ?If you have a medical appointment, call the healthcare provider ahead of time and tell them that you have or may have COVID-19. ?For medical emergencies, call 911 and notify the dispatch personnel that you have or may have COVID-19. ?Cover your cough and sneezes with a tissue or use the inside of your elbow. ?Wash your hands often with soap and water for at least 20 seconds or clean your hands with an alcohol-based hand sanitizer that contains at least 60% alcohol. ?As much as possible, stay in a specific room and away from other people in your home. Also, you should use a separate bathroom, if available. If you need to be around other people in or outside of the home, wear a mask. ?Avoid sharing personal items with other people in your household, like dishes, towels, and bedding. ?Clean all surfaces that are touched often, like counters, tabletops, and doorknobs. Use household cleaning sprays or wipes according to the label instructions. ?cdc.gov/coronavirus ?12/25/2019 ?This information is not intended to replace advice given to you by your health care provider. Make sure you discuss any questions you have with your health care provider. ?Document Revised: 02/17/2021 Document Reviewed: 02/17/2021 ?Elsevier Patient Education ? 2022 Elsevier Inc. ? ?

## 2021-06-02 NOTE — Progress Notes (Signed)
Subjective:  Patient ID: Robert Richmond, male    DOB: December 04, 1985  Age: 35 y.o. MRN: [id number]  CC: URI  This visit occurred during the SARS-CoV-2 public health emergency.  Safety protocols were in place, including screening questions prior to the visit, additional usage of staff PPE, and extensive cleaning of exam room while observing appropriate contact time as indicated for disinfecting solutions.    HPI Robert Richmond presents for f/up-  He complains of a 4 day hx of LGF, ST, NP cough, and night sweats.  Outpatient Medications Prior to Visit  Medication Sig Dispense Refill   ELDERBERRY PO Take by mouth.     emtricitabine-tenofovir (TRUVADA) 200-300 MG tablet Take 1 tablet by mouth once daily 90 tablet 0   hydrOXYzine (ATARAX/VISTARIL) 10 MG tablet Take 1 tablet (10 mg total) by mouth every 8 (eight) hours as needed. 90 tablet 3   nirmatrelvir/ritonavir EUA (PAXLOVID) 20 x 150 MG & 10 x 100MG  TABS Take 3 tablets by mouth 2 (two) times daily for 5 days. (Take nirmatrelvir 150 mg two tablets twice daily for 5 days and ritonavir 100 mg one tablet twice daily for 5 days) Patient GFR is 91 30 tablet 0   No facility-administered medications prior to visit.    ROS Review of Systems  Constitutional:  Positive for fatigue and fever.  HENT:  Positive for congestion, rhinorrhea and sore throat. Negative for postnasal drip, sinus pressure, sinus pain, sneezing and trouble swallowing.   Eyes:  Negative for visual disturbance.  Respiratory:  Positive for cough. Negative for shortness of breath and wheezing.   Cardiovascular:  Negative for chest pain, palpitations and leg swelling.  Gastrointestinal:  Negative for abdominal pain, diarrhea, nausea and vomiting.  Genitourinary: Negative.  Negative for difficulty urinating, dysuria and hematuria.  Musculoskeletal: Negative.  Negative for myalgias.  Skin: Negative.  Negative for rash.  Neurological:  Positive for headaches. Negative for dizziness and  weakness.  Hematological:  Negative for adenopathy. Does not bruise/bleed easily.  Psychiatric/Behavioral: Negative.     Objective:  BP 108/78 (BP Location: Left Arm, Patient Position: Sitting, Cuff Size: Normal)    Pulse 72    Temp 98.8 F (37.1 C) (Oral)    Resp 16    Wt 177 lb (80.3 kg)    SpO2 98%    BMI 26.14 kg/m   BP Readings from Last 3 Encounters:  06/02/21 108/78  04/24/21 106/64  11/29/20 116/78    Wt Readings from Last 3 Encounters:  06/02/21 177 lb (80.3 kg)  04/24/21 177 lb (80.3 kg)  11/29/20 175 lb (79.4 kg)    Physical Exam Vitals reviewed.  Constitutional:      Appearance: He is not ill-appearing.  HENT:     Nose: Nose normal.     Mouth/Throat:     Mouth: Mucous membranes are moist.     Pharynx: Posterior oropharyngeal erythema present. No pharyngeal swelling or oropharyngeal exudate.     Tonsils: No tonsillar exudate.  Eyes:     Conjunctiva/sclera: Conjunctivae normal.  Cardiovascular:     Rate and Rhythm: Normal rate and regular rhythm.     Heart sounds: No murmur heard. Pulmonary:     Effort: Pulmonary effort is normal.     Breath sounds: No stridor. No wheezing, rhonchi or rales.  Abdominal:     General: Abdomen is flat.     Palpations: There is no mass.     Tenderness: There is no abdominal tenderness. There is no guarding.  Musculoskeletal:        General: Normal range of motion.     Cervical back: Neck supple.  Lymphadenopathy:     Head:     Right side of head: No occipital adenopathy.     Left side of head: No occipital adenopathy.     Cervical: No cervical adenopathy.     Right cervical: No superficial cervical adenopathy.    Left cervical: No superficial cervical adenopathy.     Upper Body:     Right upper body: No supraclavicular or axillary adenopathy.     Left upper body: No supraclavicular or axillary adenopathy.  Skin:    General: Skin is warm and dry.     Findings: No rash.  Neurological:     General: No focal deficit  present.     Mental Status: He is alert.    Lab Results  Component Value Date   WBC 6.2 11/29/2020   HGB 16.1 11/29/2020   HCT 47.4 11/29/2020   PLT 193.0 11/29/2020   GLUCOSE 97 11/29/2020   CHOL 152 07/14/2020   TRIG 124.0 07/14/2020   HDL 47.10 07/14/2020   LDLCALC 80 07/14/2020   ALT 22 05/22/2021   AST 18 05/22/2021   NA 138 11/29/2020   K 4.5 11/29/2020   CL 103 11/29/2020   CREATININE 1.06 11/29/2020   BUN 16 11/29/2020   CO2 30 11/29/2020   TSH 1.49 07/14/2020    DG Chest 2 View  Result Date: 08/14/2017 CLINICAL DATA:  Heart pounding, chest pain EXAM: CHEST - 2 VIEW COMPARISON:  None. FINDINGS: The heart size and mediastinal contours are within normal limits. Both lungs are clear. The visualized skeletal structures are unremarkable. IMPRESSION: No active cardiopulmonary disease. Electronically Signed   By: Donavan Foil M.D.   On: 08/14/2017 23:03    Assessment & Plan:   Robert Richmond was seen today for uri.  Diagnoses and all orders for this visit:  Sore throat- RSS is -. COVID is +. -     POCT rapid strep A -     POC COVID-19  Acute COVID-19- Will treat with boosted nirmatrelvir. -     nirmatrelvir/ritonavir EUA (PAXLOVID) 20 x 150 MG & 10 x 100MG  TABS; Take 3 tablets by mouth 2 (two) times daily for 5 days. (Take nirmatrelvir 150 mg two tablets twice daily for 5 days and ritonavir 100 mg one tablet twice daily for 5 days) Patient GFR is 91 -     promethazine (PHENERGAN) 12.5 MG tablet; Take 1 tablet (12.5 mg total) by mouth every 6 (six) hours as needed for nausea or vomiting.   I am having Robert Richmond start on promethazine. I am also having him maintain his ELDERBERRY PO, hydrOXYzine, emtricitabine-tenofovir, and nirmatrelvir/ritonavir EUA.  Meds ordered this encounter  Medications   nirmatrelvir/ritonavir EUA (PAXLOVID) 20 x 150 MG & 10 x 100MG  TABS    Sig: Take 3 tablets by mouth 2 (two) times daily for 5 days. (Take nirmatrelvir 150 mg two tablets twice daily  for 5 days and ritonavir 100 mg one tablet twice daily for 5 days) Patient GFR is 91    Dispense:  30 tablet    Refill:  0   promethazine (PHENERGAN) 12.5 MG tablet    Sig: Take 1 tablet (12.5 mg total) by mouth every 6 (six) hours as needed for nausea or vomiting.    Dispense:  30 tablet    Refill:  0     Follow-up: Return if symptoms  worsen or fail to improve.  Scarlette Calico, MD

## 2021-08-23 ENCOUNTER — Ambulatory Visit: Payer: BC Managed Care – PPO | Admitting: Internal Medicine

## 2021-08-23 VITALS — BP 126/82 | HR 86 | Temp 98.0°F | Resp 16

## 2021-08-23 DIAGNOSIS — J029 Acute pharyngitis, unspecified: Secondary | ICD-10-CM

## 2021-08-23 LAB — POC COVID19 BINAXNOW: SARS Coronavirus 2 Ag: NEGATIVE

## 2021-08-23 NOTE — Progress Notes (Signed)
? ?Subjective:  ?Patient ID: Robert Richmond, male    DOB: Feb 10, 1986  Age: 36 y.o. MRN: [id number] ? ?CC: Sore Throat ? ?This visit occurred during the SARS-CoV-2 public health emergency.  Safety protocols were in place, including screening questions prior to the visit, additional usage of staff PPE, and extensive cleaning of exam room while observing appropriate contact time as indicated for disinfecting solutions.   ? ?HPI ?[name] presents for a 2 day history of scratchy throat. ? ?Outpatient Medications Prior to Visit  ?Medication Sig Dispense Refill  ? ELDERBERRY PO Take by mouth.    ? emtricitabine-tenofovir (TRUVADA) 200-300 MG tablet Take 1 tablet by mouth once daily 90 tablet 0  ? hydrOXYzine (ATARAX/VISTARIL) 10 MG tablet Take 1 tablet (10 mg total) by mouth every 8 (eight) hours as needed. 90 tablet 3  ? promethazine (PHENERGAN) 12.5 MG tablet Take 1 tablet (12.5 mg total) by mouth every 6 (six) hours as needed for nausea or vomiting. 30 tablet 0  ? ?No facility-administered medications prior to visit.  ? ? ?ROS ?Review of Systems  ?Constitutional:  Negative for chills, fatigue and fever.  ?HENT:  Positive for sore throat. Negative for sinus pressure, trouble swallowing and voice change.   ?Eyes: Negative.   ?Respiratory: Negative.  Negative for cough and shortness of breath.   ?Cardiovascular:  Negative for chest pain, palpitations and leg swelling.  ?Gastrointestinal:  Negative for abdominal pain.  ?Endocrine: Negative.   ?Genitourinary: Negative.   ?Musculoskeletal: Negative.   ?Skin: Negative.  Negative for rash.  ?Neurological: Negative.   ?Hematological:  Negative for adenopathy. Does not bruise/bleed easily.  ?Psychiatric/Behavioral: Negative.    ? ?Objective:  ?BP 126/82 (BP Location: Right Arm, Patient Position: Sitting, Cuff Size: Normal)   Pulse 86   Temp 98 ?F (36.7 ?C)   Resp 16   SpO2 98%  ? ?BP Readings from Last 3 Encounters:  ?08/23/21 126/82  ?06/02/21 108/78  ?04/24/21 106/64  ? ? ?Wt  Readings from Last 3 Encounters:  ?06/02/21 177 lb (80.3 kg)  ?04/24/21 177 lb (80.3 kg)  ?11/29/20 175 lb (79.4 kg)  ? ? ?Physical Exam ?Vitals reviewed.  ?Constitutional:   ?   General: He is not in acute distress. ?   Appearance: He is not ill-appearing, toxic-appearing or diaphoretic.  ?HENT:  ?   Mouth/Throat:  ?   Palate: No mass.  ?   Pharynx: Oropharynx is clear. No pharyngeal swelling, oropharyngeal exudate, posterior oropharyngeal erythema or uvula swelling.  ?   Tonsils: No tonsillar exudate or tonsillar abscesses.  ?Eyes:  ?   General: No scleral icterus. ?   Conjunctiva/sclera: Conjunctivae normal.  ?Cardiovascular:  ?   Rate and Rhythm: Normal rate and regular rhythm.  ?   Heart sounds: No murmur heard. ?Pulmonary:  ?   Effort: Pulmonary effort is normal.  ?   Breath sounds: No stridor. No wheezing, rhonchi or rales.  ?Abdominal:  ?   General: Abdomen is flat.  ?   Palpations: There is no mass.  ?   Tenderness: There is no abdominal tenderness. There is no guarding.  ?   Hernia: No hernia is present.  ?Musculoskeletal:     ?   General: Normal range of motion.  ?   Cervical back: Neck supple.  ?Lymphadenopathy:  ?   Cervical: No cervical adenopathy.  ?Skin: ?   General: Skin is warm and dry.  ?   Findings: No rash.  ?Neurological:  ?  General: No focal deficit present.  ?   Mental Status: He is alert.  ?Psychiatric:     ?   Mood and Affect: Mood normal.     ?   Behavior: Behavior normal.  ? ? ?Lab Results  ?Component Value Date  ? WBC 6.2 11/29/2020  ? HGB 16.1 11/29/2020  ? HCT 47.4 11/29/2020  ? PLT 193.0 11/29/2020  ? GLUCOSE 97 11/29/2020  ? CHOL 152 07/14/2020  ? TRIG 124.0 07/14/2020  ? HDL 47.10 07/14/2020  ? [place] 80 07/14/2020  ? ALT 22 05/22/2021  ? AST 18 05/22/2021  ? NA 138 11/29/2020  ? K 4.5 11/29/2020  ? CL 103 11/29/2020  ? CREATININE 1.06 11/29/2020  ? BUN 16 11/29/2020  ? CO2 30 11/29/2020  ? TSH 1.49 07/14/2020  ? ? ?DG Chest 2 View ? ?Result Date: 08/14/2017 ?CLINICAL DATA:  Heart  pounding, chest pain EXAM: CHEST - 2 VIEW COMPARISON:  None. FINDINGS: The heart size and mediastinal contours are within normal limits. Both lungs are clear. The visualized skeletal structures are unremarkable. IMPRESSION: No active cardiopulmonary disease. Electronically Signed   By: Donavan Foil M.D.   On: 08/14/2017 23:03  ? ? ?Assessment & Plan:  ? ?Robert Richmond was seen today for sore throat. ? ?Diagnoses and all orders for this visit: ? ?Pharyngitis, unspecified etiology- COVID ag is negative. This is viral. Antibiotics are not indicated. ?-     POC COVID-19 ? ? ?I am having Carlens Merker maintain his ELDERBERRY PO, hydrOXYzine, emtricitabine-tenofovir, and promethazine. ? ?No orders of the defined types were placed in this encounter. ? ? ? ?Follow-up: No follow-ups on file. ? ?Robert Calico, MD ?

## 2021-08-24 ENCOUNTER — Encounter: Payer: Self-pay | Admitting: Internal Medicine

## 2021-08-24 NOTE — Patient Instructions (Signed)
Sore Throat ?A sore throat is pain, burning, irritation, or scratchiness in the throat. When you have a sore throat, you may feel pain or tenderness in your throat when you swallow or talk. ?Many things can cause a sore throat, including: ?An infection. ?Seasonal allergies. ?Dryness in the air. ?Irritants, such as smoke or pollution. ?Radiation treatment for cancer. ?Gastroesophageal reflux disease (GERD). ?A tumor. ?A sore throat is often the first sign of another sickness. It may happen with other symptoms, such as coughing, sneezing, fever, and swollen neck glands. Most sore throats go away without medical treatment. ?Follow these instructions at home: ?  ?Medicines ?Take over-the-counter and prescription medicines only as told by your health care provider. ?Children often get sore throats. Do not give your child aspirin because of the association with Reye's syndrome. ?Use throat sprays to soothe your throat as told by your health care provider. ?Managing pain ?To help with pain, try: ?Sipping warm liquids, such as broth, herbal tea, or warm water. ?Eating or drinking cold or frozen liquids, such as frozen ice pops. ?Gargling with a mixture of salt and water 3-4 times a day or as needed. To make salt water, completely dissolve ?-1 tsp (3-6 g) of salt in 1 cup (237 mL) of warm water. ?Sucking on hard candy or throat lozenges. ?Putting a cool-mist humidifier in your bedroom at night to moisten the air. ?Sitting in the bathroom with the door closed for 5-10 minutes while you run hot water in the shower. ?General instructions ?Do not use any products that contain nicotine or tobacco. These products include cigarettes, chewing tobacco, and vaping devices, such as e-cigarettes. If you need help quitting, ask your health care provider. ?Rest as needed. ?Drink enough fluid to keep your urine pale yellow. ?Wash your hands often with soap and water for at least 20 seconds. If soap and water are not available, use hand  sanitizer. ?Contact a health care provider if: ?You have a fever for more than 2-3 days. ?You have symptoms that last for more than 2-3 days. ?Your throat does not get better within 7 days. ?You have a fever and your symptoms suddenly get worse. ?Get help right away if: ?You have difficulty breathing. ?You cannot swallow fluids, soft foods, or your saliva. ?You have increased swelling in your throat or neck. ?You have persistent nausea and vomiting. ?These symptoms may represent a serious problem that is an emergency. Do not wait to see if the symptoms will go away. Get medical help right away. Call your local emergency services (911 in the [place] Do not drive yourself to the hospital. ?Summary ?A sore throat is pain, burning, irritation, or scratchiness in the throat. Many things can cause a sore throat. ?Take over-the-counter medicines only as told by your health care provider. ?Rest as needed. ?Drink enough fluid to keep your urine pale yellow. ?Contact a health care provider if your throat does not get better within 7 days. ?This information is not intended to replace advice given to you by your health care provider. Make sure you discuss any questions you have with your health care provider. ?Document Revised: 08/24/2020 Document Reviewed: 08/24/2020 ?Elsevier Patient Education ? 2022 [employer] ? ?

## 2021-11-15 ENCOUNTER — Ambulatory Visit: Payer: BC Managed Care – PPO | Admitting: Internal Medicine

## 2021-11-17 ENCOUNTER — Ambulatory Visit: Payer: BC Managed Care – PPO | Admitting: Internal Medicine

## 2021-11-17 ENCOUNTER — Encounter: Payer: Self-pay | Admitting: Internal Medicine

## 2021-11-17 VITALS — BP 120/80 | HR 77 | Temp 98.2°F | Ht 69.0 in | Wt 181.8 lb

## 2021-11-17 DIAGNOSIS — Z0001 Encounter for general adult medical examination with abnormal findings: Secondary | ICD-10-CM | POA: Diagnosis not present

## 2021-11-17 DIAGNOSIS — Z7252 High risk homosexual behavior: Secondary | ICD-10-CM

## 2021-11-17 DIAGNOSIS — K219 Gastro-esophageal reflux disease without esophagitis: Secondary | ICD-10-CM

## 2021-11-17 DIAGNOSIS — R768 Other specified abnormal immunological findings in serum: Secondary | ICD-10-CM | POA: Diagnosis not present

## 2021-11-17 DIAGNOSIS — Z113 Encounter for screening for infections with a predominantly sexual mode of transmission: Secondary | ICD-10-CM

## 2021-11-17 LAB — CBC WITH DIFFERENTIAL/PLATELET
Basophils Absolute: 0 10*3/uL (ref 0.0–0.1)
Basophils Relative: 0.2 % (ref 0.0–3.0)
Eosinophils Absolute: 0.1 10*3/uL (ref 0.0–0.7)
Eosinophils Relative: 1.6 % (ref 0.0–5.0)
HCT: 45.7 % (ref 39.0–52.0)
Hemoglobin: 15.3 g/dL (ref 13.0–17.0)
Lymphocytes Relative: 37.4 % (ref 12.0–46.0)
Lymphs Abs: 2.1 10*3/uL (ref 0.7–4.0)
MCHC: 33.5 g/dL (ref 30.0–36.0)
MCV: 85.6 fl (ref 78.0–100.0)
Monocytes Absolute: 0.4 10*3/uL (ref 0.1–1.0)
Monocytes Relative: 6.5 % (ref 3.0–12.0)
Neutro Abs: 3 10*3/uL (ref 1.4–7.7)
Neutrophils Relative %: 54.3 % (ref 43.0–77.0)
Platelets: 193 10*3/uL (ref 150.0–400.0)
RBC: 5.34 Mil/uL (ref 4.22–5.81)
RDW: 13 % (ref 11.5–15.5)
WBC: 5.6 10*3/uL (ref 4.0–10.5)

## 2021-11-17 LAB — BASIC METABOLIC PANEL
BUN: 11 mg/dL (ref 6–23)
CO2: 30 mEq/L (ref 19–32)
Calcium: 9 mg/dL (ref 8.4–10.5)
Chloride: 103 mEq/L (ref 96–112)
Creatinine, Ser: 1.04 mg/dL (ref 0.40–1.50)
GFR: 92.76 mL/min (ref >60.00)
Glucose, Bld: 94 mg/dL (ref 70–99)
Potassium: 4.1 mEq/L (ref 3.5–5.1)
Sodium: 138 mEq/L (ref 135–145)

## 2021-11-17 LAB — HEPATIC FUNCTION PANEL
ALT: 27 U/L (ref 0–53)
AST: 26 U/L (ref 0–37)
Albumin: 4.3 g/dL (ref 3.5–5.2)
Alkaline Phosphatase: 75 U/L (ref 39–117)
Bilirubin, Direct: 0.1 mg/dL (ref 0.0–0.3)
Total Bilirubin: 0.6 mg/dL (ref 0.2–1.2)
Total Protein: 6.9 g/dL (ref 6.0–8.3)

## 2021-11-17 NOTE — Patient Instructions (Signed)
Health Maintenance, Male Adopting a healthy lifestyle and getting preventive care are important in promoting health and wellness. Ask your health care provider about: The right schedule for you to have regular tests and exams. Things you can do on your own to prevent diseases and keep yourself healthy. What should I know about diet, weight, and exercise? Eat a healthy diet  Eat a diet that includes plenty of vegetables, fruits, low-fat dairy products, and lean protein. Do not eat a lot of foods that are high in solid fats, added sugars, or sodium. Maintain a healthy weight Body mass index (BMI) is a measurement that can be used to identify possible weight problems. It estimates body fat based on height and weight. Your health care provider can help determine your BMI and help you achieve or maintain a healthy weight. Get regular exercise Get regular exercise. This is one of the most important things you can do for your health. Most adults should: Exercise for at least 150 minutes each week. The exercise should increase your heart rate and make you sweat (moderate-intensity exercise). Do strengthening exercises at least twice a week. This is in addition to the moderate-intensity exercise. Spend less time sitting. Even light physical activity can be beneficial. Watch cholesterol and blood lipids Have your blood tested for lipids and cholesterol at 36 years of age, then have this test every 5 years. You may need to have your cholesterol levels checked more often if: Your lipid or cholesterol levels are high. You are older than 36 years of age. You are at high risk for heart disease. What should I know about cancer screening? Many types of cancers can be detected early and may often be prevented. Depending on your health history and family history, you may need to have cancer screening at various ages. This may include screening for: Colorectal cancer. Prostate cancer. Skin cancer. Lung  cancer. What should I know about heart disease, diabetes, and high blood pressure? Blood pressure and heart disease High blood pressure causes heart disease and increases the risk of stroke. This is more likely to develop in people who have high blood pressure readings or are overweight. Talk with your health care provider about your target blood pressure readings. Have your blood pressure checked: Every 3-5 years if you are 18-39 years of age. Every year if you are 40 years old or older. If you are between the ages of 65 and 75 and are a current or former smoker, ask your health care provider if you should have a one-time screening for abdominal aortic aneurysm (AAA). Diabetes Have regular diabetes screenings. This checks your fasting blood sugar level. Have the screening done: Once every three years after age 45 if you are at a normal weight and have a low risk for diabetes. More often and at a younger age if you are overweight or have a high risk for diabetes. What should I know about preventing infection? Hepatitis B If you have a higher risk for hepatitis B, you should be screened for this virus. Talk with your health care provider to find out if you are at risk for hepatitis B infection. Hepatitis C Blood testing is recommended for: Everyone born from 1945 through 1965. Anyone with known risk factors for hepatitis C. Sexually transmitted infections (STIs) You should be screened each year for STIs, including gonorrhea and chlamydia, if: You are sexually active and are younger than 36 years of age. You are older than 36 years of age and your   health care provider tells you that you are at risk for this type of infection. Your sexual activity has changed since you were last screened, and you are at increased risk for chlamydia or gonorrhea. Ask your health care provider if you are at risk. Ask your health care provider about whether you are at high risk for HIV. Your health care provider  may recommend a prescription medicine to help prevent HIV infection. If you choose to take medicine to prevent HIV, you should first get tested for HIV. You should then be tested every 3 months for as long as you are taking the medicine. Follow these instructions at home: Alcohol use Do not drink alcohol if your health care provider tells you not to drink. If you drink alcohol: Limit how much you have to 0-2 drinks a day. Know how much alcohol is in your drink. In the [place] one drink equals one 12 oz bottle of beer (355 mL), one 5 oz glass of wine (148 mL), or one 1 oz glass of hard liquor (44 mL). Lifestyle Do not use any products that contain nicotine or tobacco. These products include cigarettes, chewing tobacco, and vaping devices, such as e-cigarettes. If you need help quitting, ask your health care provider. Do not use street drugs. Do not share needles. Ask your health care provider for help if you need support or information about quitting drugs. General instructions Schedule regular health, dental, and eye exams. Stay current with your vaccines. Tell your health care provider if: You often feel depressed. You have ever been abused or do not feel safe at home. Summary Adopting a healthy lifestyle and getting preventive care are important in promoting health and wellness. Follow your health care provider's instructions about healthy diet, exercising, and getting tested or screened for diseases. Follow your health care provider's instructions on monitoring your cholesterol and blood pressure. This information is not intended to replace advice given to you by your health care provider. Make sure you discuss any questions you have with your health care provider. Document Revised: 10/17/2020 Document Reviewed: 10/17/2020 Elsevier Patient Education  2023 [employer] Inc.  

## 2021-11-17 NOTE — Progress Notes (Unsigned)
Subjective:  Patient ID: Robert Richmond, male    DOB: 11/19/1985  Age: 36 y.o. MRN: [id number]  CC: Follow-up   HPI Robert Richmond presents for ***  Outpatient Medications Prior to Visit  Medication Sig Dispense Refill   emtricitabine-tenofovir (TRUVADA) 200-300 MG tablet Take 1 tablet by mouth once daily (Patient not taking: Reported on 11/17/2021) 90 tablet 0   ELDERBERRY PO Take by mouth. (Patient not taking: Reported on 11/17/2021)     hydrOXYzine (ATARAX/VISTARIL) 10 MG tablet Take 1 tablet (10 mg total) by mouth every 8 (eight) hours as needed. (Patient not taking: Reported on 11/17/2021) 90 tablet 3   promethazine (PHENERGAN) 12.5 MG tablet Take 1 tablet (12.5 mg total) by mouth every 6 (six) hours as needed for nausea or vomiting. (Patient not taking: Reported on 11/17/2021) 30 tablet 0   No facility-administered medications prior to visit.    ROS Review of Systems  Objective:  BP 120/80 (BP Location: Left Arm, Patient Position: Sitting, Cuff Size: Normal)   Pulse 77   Temp 98.2 F (36.8 C) (Oral)   Ht 5\' 9"  (1.753 m)   Wt 181 lb 12.8 oz (82.5 kg)   SpO2 97%   BMI 26.85 kg/m   BP Readings from Last 3 Encounters:  11/17/21 120/80  08/23/21 126/82  06/02/21 108/78    Wt Readings from Last 3 Encounters:  11/17/21 181 lb 12.8 oz (82.5 kg)  06/02/21 177 lb (80.3 kg)  04/24/21 177 lb (80.3 kg)    Physical Exam  Lab Results  Component Value Date   WBC 6.2 11/29/2020   HGB 16.1 11/29/2020   HCT 47.4 11/29/2020   PLT 193.0 11/29/2020   GLUCOSE 97 11/29/2020   CHOL 152 07/14/2020   TRIG 124.0 07/14/2020   HDL 47.10 07/14/2020   LDLCALC 80 07/14/2020   ALT 22 05/22/2021   AST 18 05/22/2021   NA 138 11/29/2020   K 4.5 11/29/2020   CL 103 11/29/2020   CREATININE 1.06 11/29/2020   BUN 16 11/29/2020   CO2 30 11/29/2020   TSH 1.49 07/14/2020    DG Chest 2 View  Result Date: 08/14/2017 CLINICAL DATA:  Heart pounding, chest pain EXAM: CHEST - 2 VIEW COMPARISON:  None.  FINDINGS: The heart size and mediastinal contours are within normal limits. Both lungs are clear. The visualized skeletal structures are unremarkable. IMPRESSION: No active cardiopulmonary disease. Electronically Signed   By: 10/14/2017 M.D.   On: 08/14/2017 23:03    Assessment & Plan:   Robert Richmond was seen today for follow-up.  Diagnoses and all orders for this visit:  Routine screening for STI (sexually transmitted infection) -     Hepatitis B surface antibody,quantitative; Future -     Hepatitis A antibody, total; Future -     Hepatitis B core antibody, total; Future  High risk homosexual behavior -     Basic metabolic panel; Future -     CBC with Differential/Platelet; Future -     HIV Antibody (routine testing w rflx); Future -     RPR; Future -     Hepatitis B surface antigen; Future -     Hepatitis B surface antibody,quantitative; Future -     Hepatitis A antibody, total; Future -     Hepatitis B core antibody, total; Future  Gastroesophageal reflux disease without esophagitis -     Basic metabolic panel; Future -     CBC with Differential/Platelet; Future -     Hepatic function  panel; Future  Hepatitis B core antibody positive -     Hepatitis B surface antigen; Future -     Hepatic function panel; Future -     Hepatitis B surface antibody,quantitative; Future -     Hepatitis A antibody, total; Future -     Hepatitis B core antibody, total; Future   I have discontinued Robert Richmond's ELDERBERRY PO, hydrOXYzine, and promethazine. I am also having him maintain his emtricitabine-tenofovir.  No orders of the defined types were placed in this encounter.    Follow-up: Return in about 3 months (around 02/17/2022).  [name], MD

## 2021-11-20 DIAGNOSIS — Z0001 Encounter for general adult medical examination with abnormal findings: Secondary | ICD-10-CM | POA: Insufficient documentation

## 2021-11-20 LAB — HEPATITIS B CORE ANTIBODY, TOTAL: Hep B Core Total Ab: NONREACTIVE

## 2021-11-20 LAB — RPR: RPR Ser Ql: NONREACTIVE

## 2021-11-20 LAB — HEPATITIS A ANTIBODY, TOTAL: Hepatitis A AB,Total: REACTIVE — AB

## 2021-11-20 LAB — HIV ANTIBODY (ROUTINE TESTING W REFLEX): HIV 1&2 Ab, 4th Generation: NONREACTIVE

## 2021-11-20 LAB — HEPATITIS B SURFACE ANTIBODY, QUANTITATIVE: Hep B S AB Quant (Post): 7 m[IU]/mL — ABNORMAL LOW (ref >=10)

## 2021-11-20 LAB — HEPATITIS B SURFACE ANTIGEN: Hepatitis B Surface Ag: NONREACTIVE

## 2021-11-20 MED ORDER — EMTRICITABINE-TENOFOVIR DF 200-300 MG PO TABS: 1.0000 | ORAL_TABLET | Freq: Every day | ORAL | 0 refills | Status: DC

## 2022-01-23 DIAGNOSIS — M25512 Pain in left shoulder: Secondary | ICD-10-CM | POA: Diagnosis not present

## 2022-01-23 DIAGNOSIS — F40240 Claustrophobia: Secondary | ICD-10-CM | POA: Diagnosis not present

## 2022-01-23 DIAGNOSIS — M25312 Other instability, left shoulder: Secondary | ICD-10-CM | POA: Diagnosis not present

## 2022-01-23 DIAGNOSIS — M75102 Unspecified rotator cuff tear or rupture of left shoulder, not specified as traumatic: Secondary | ICD-10-CM | POA: Diagnosis not present

## 2022-02-06 DIAGNOSIS — M25512 Pain in left shoulder: Secondary | ICD-10-CM | POA: Diagnosis not present

## 2022-02-06 DIAGNOSIS — M75102 Unspecified rotator cuff tear or rupture of left shoulder, not specified as traumatic: Secondary | ICD-10-CM | POA: Diagnosis not present

## 2022-02-06 DIAGNOSIS — M9901 Segmental and somatic dysfunction of cervical region: Secondary | ICD-10-CM | POA: Diagnosis not present

## 2022-02-06 DIAGNOSIS — M25312 Other instability, left shoulder: Secondary | ICD-10-CM | POA: Diagnosis not present

## 2022-03-06 DIAGNOSIS — M9901 Segmental and somatic dysfunction of cervical region: Secondary | ICD-10-CM | POA: Diagnosis not present

## 2022-03-06 DIAGNOSIS — M542 Cervicalgia: Secondary | ICD-10-CM | POA: Diagnosis not present

## 2022-03-06 DIAGNOSIS — M25512 Pain in left shoulder: Secondary | ICD-10-CM | POA: Diagnosis not present

## 2022-03-06 DIAGNOSIS — M9907 Segmental and somatic dysfunction of upper extremity: Secondary | ICD-10-CM | POA: Diagnosis not present

## 2022-07-02 DIAGNOSIS — M9901 Segmental and somatic dysfunction of cervical region: Secondary | ICD-10-CM | POA: Diagnosis not present

## 2022-07-02 DIAGNOSIS — M25512 Pain in left shoulder: Secondary | ICD-10-CM | POA: Diagnosis not present

## 2022-07-02 DIAGNOSIS — M542 Cervicalgia: Secondary | ICD-10-CM | POA: Diagnosis not present

## 2022-07-02 DIAGNOSIS — M25312 Other instability, left shoulder: Secondary | ICD-10-CM | POA: Diagnosis not present

## 2022-10-24 DIAGNOSIS — R052 Subacute cough: Secondary | ICD-10-CM | POA: Diagnosis not present

## 2022-10-24 DIAGNOSIS — J3089 Other allergic rhinitis: Secondary | ICD-10-CM | POA: Diagnosis not present

## 2022-10-24 DIAGNOSIS — J709 Respiratory conditions due to unspecified external agent: Secondary | ICD-10-CM | POA: Diagnosis not present

## 2022-12-31 ENCOUNTER — Other Ambulatory Visit: Payer: Self-pay | Admitting: Internal Medicine

## 2022-12-31 DIAGNOSIS — U071 COVID-19: Secondary | ICD-10-CM

## 2022-12-31 MED ORDER — NIRMATRELVIR/RITONAVIR (PAXLOVID)TABLET: 3.0000 | ORAL_TABLET | Freq: Two times a day (BID) | ORAL | 0 refills | Status: AC

## 2023-05-23 ENCOUNTER — Telehealth: Payer: Self-pay | Admitting: Internal Medicine

## 2023-05-24 ENCOUNTER — Ambulatory Visit: Payer: BC Managed Care – PPO | Admitting: Internal Medicine

## 2023-05-24 ENCOUNTER — Ambulatory Visit (INDEPENDENT_AMBULATORY_CARE_PROVIDER_SITE_OTHER): Payer: BC Managed Care – PPO

## 2023-05-24 ENCOUNTER — Encounter: Payer: Self-pay | Admitting: Internal Medicine

## 2023-05-24 VITALS — BP 116/82 | HR 86 | Temp 97.8°F | Ht 69.0 in | Wt 178.4 lb

## 2023-05-24 DIAGNOSIS — Z0001 Encounter for general adult medical examination with abnormal findings: Secondary | ICD-10-CM

## 2023-05-24 DIAGNOSIS — M898X9 Other specified disorders of bone, unspecified site: Secondary | ICD-10-CM | POA: Diagnosis not present

## 2023-05-24 DIAGNOSIS — Z7252 High risk homosexual behavior: Secondary | ICD-10-CM | POA: Diagnosis not present

## 2023-05-24 DIAGNOSIS — K2100 Gastro-esophageal reflux disease with esophagitis, without bleeding: Secondary | ICD-10-CM | POA: Insufficient documentation

## 2023-05-24 DIAGNOSIS — Z0000 Encounter for general adult medical examination without abnormal findings: Secondary | ICD-10-CM

## 2023-05-24 DIAGNOSIS — F411 Generalized anxiety disorder: Secondary | ICD-10-CM | POA: Diagnosis not present

## 2023-05-24 DIAGNOSIS — R768 Other specified abnormal immunological findings in serum: Secondary | ICD-10-CM

## 2023-05-24 DIAGNOSIS — R0789 Other chest pain: Secondary | ICD-10-CM | POA: Diagnosis not present

## 2023-05-24 DIAGNOSIS — J309 Allergic rhinitis, unspecified: Secondary | ICD-10-CM | POA: Insufficient documentation

## 2023-05-24 LAB — URINALYSIS, ROUTINE W REFLEX MICROSCOPIC
Bilirubin Urine: NEGATIVE
Hgb urine dipstick: NEGATIVE
Ketones, ur: NEGATIVE
Leukocytes,Ua: NEGATIVE
Nitrite: NEGATIVE
Specific Gravity, Urine: 1.02 (ref 1.000–1.030)
Total Protein, Urine: NEGATIVE
Urine Glucose: NEGATIVE
Urobilinogen, UA: 0.2 (ref 0.0–1.0)
WBC, UA: NONE SEEN — AB (ref 0–?)
pH: 6.5 (ref 5.0–8.0)

## 2023-05-24 LAB — CBC WITH DIFFERENTIAL/PLATELET
Basophils Absolute: 0 10*3/uL (ref 0.0–0.1)
Basophils Relative: 0.2 % (ref 0.0–3.0)
Eosinophils Absolute: 0.1 10*3/uL (ref 0.0–0.7)
Eosinophils Relative: 1.4 % (ref 0.0–5.0)
HCT: 48.2 % (ref 39.0–52.0)
Hemoglobin: 16.1 g/dL (ref 13.0–17.0)
Lymphocytes Relative: 37.7 % (ref 12.0–46.0)
Lymphs Abs: 2 10*3/uL (ref 0.7–4.0)
MCHC: 33.4 g/dL (ref 30.0–36.0)
MCV: 86.7 fL (ref 78.0–100.0)
Monocytes Absolute: 0.3 10*3/uL (ref 0.1–1.0)
Monocytes Relative: 6.2 % (ref 3.0–12.0)
Neutro Abs: 2.9 10*3/uL (ref 1.4–7.7)
Neutrophils Relative %: 54.5 % (ref 43.0–77.0)
Platelets: 217 10*3/uL (ref 150.0–400.0)
RBC: 5.56 Mil/uL (ref 4.22–5.81)
RDW: 13.1 % (ref 11.5–15.5)
WBC: 5.3 10*3/uL (ref 4.0–10.5)

## 2023-05-24 LAB — BASIC METABOLIC PANEL
BUN: 16 mg/dL (ref 6–23)
CO2: 29 meq/L (ref 19–32)
Calcium: 9 mg/dL (ref 8.4–10.5)
Chloride: 103 meq/L (ref 96–112)
Creatinine, Ser: 1.08 mg/dL (ref 0.40–1.50)
GFR: 87.72 mL/min (ref >60.00)
Glucose, Bld: 99 mg/dL (ref 70–99)
Potassium: 4.5 meq/L (ref 3.5–5.1)
Sodium: 138 meq/L (ref 135–145)

## 2023-05-24 LAB — HEPATIC FUNCTION PANEL
ALT: 38 U/L (ref 0–53)
AST: 29 U/L (ref 0–37)
Albumin: 4.1 g/dL (ref 3.5–5.2)
Alkaline Phosphatase: 69 U/L (ref 39–117)
Bilirubin, Direct: 0.2 mg/dL (ref 0.0–0.3)
Total Bilirubin: 0.9 mg/dL (ref 0.2–1.2)
Total Protein: 7.1 g/dL (ref 6.0–8.3)

## 2023-05-24 LAB — TSH: TSH: 1.94 u[IU]/mL (ref 0.35–5.50)

## 2023-05-24 MED ORDER — VOQUEZNA 20 MG PO TABS: 1.0000 | ORAL_TABLET | Freq: Every day | ORAL | 0 refills | Status: DC

## 2023-05-24 MED ORDER — VOQUEZNA 20 MG PO TABS: 1.0000 | ORAL_TABLET | Freq: Every day | ORAL | Status: DC

## 2023-05-24 NOTE — Patient Instructions (Signed)
Health Maintenance, Male Adopting a healthy lifestyle and getting preventive care are important in promoting health and wellness. Ask your health care provider about: The right schedule for you to have regular tests and exams. Things you can do on your own to prevent diseases and keep yourself healthy. What should I know about diet, weight, and exercise? Eat a healthy diet  Eat a diet that includes plenty of vegetables, fruits, low-fat dairy products, and lean protein. Do not eat a lot of foods that are high in solid fats, added sugars, or sodium. Maintain a healthy weight Body mass index (BMI) is a measurement that can be used to identify possible weight problems. It estimates body fat based on height and weight. Your health care provider can help determine your BMI and help you achieve or maintain a healthy weight. Get regular exercise Get regular exercise. This is one of the most important things you can do for your health. Most adults should: Exercise for at least 150 minutes each week. The exercise should increase your heart rate and make you sweat (moderate-intensity exercise). Do strengthening exercises at least twice a week. This is in addition to the moderate-intensity exercise. Spend less time sitting. Even light physical activity can be beneficial. Watch cholesterol and blood lipids Have your blood tested for lipids and cholesterol at 37 years of age, then have this test every 5 years. You may need to have your cholesterol levels checked more often if: Your lipid or cholesterol levels are high. You are older than 37 years of age. You are at high risk for heart disease. What should I know about cancer screening? Many types of cancers can be detected early and may often be prevented. Depending on your health history and family history, you may need to have cancer screening at various ages. This may include screening for: Colorectal cancer. Prostate cancer. Skin cancer. Lung  cancer. What should I know about heart disease, diabetes, and high blood pressure? Blood pressure and heart disease High blood pressure causes heart disease and increases the risk of stroke. This is more likely to develop in people who have high blood pressure readings or are overweight. Talk with your health care provider about your target blood pressure readings. Have your blood pressure checked: Every 3-5 years if you are 18-39 years of age. Every year if you are 40 years old or older. If you are between the ages of 65 and 75 and are a current or former smoker, ask your health care provider if you should have a one-time screening for abdominal aortic aneurysm (AAA). Diabetes Have regular diabetes screenings. This checks your fasting blood sugar level. Have the screening done: Once every three years after age 45 if you are at a normal weight and have a low risk for diabetes. More often and at a younger age if you are overweight or have a high risk for diabetes. What should I know about preventing infection? Hepatitis B If you have a higher risk for hepatitis B, you should be screened for this virus. Talk with your health care provider to find out if you are at risk for hepatitis B infection. Hepatitis C Blood testing is recommended for: Everyone born from 1945 through 1965. Anyone with known risk factors for hepatitis C. Sexually transmitted infections (STIs) You should be screened each year for STIs, including gonorrhea and chlamydia, if: You are sexually active and are younger than 37 years of age. You are older than 37 years of age and your   health care provider tells you that you are at risk for this type of infection. Your sexual activity has changed since you were last screened, and you are at increased risk for chlamydia or gonorrhea. Ask your health care provider if you are at risk. Ask your health care provider about whether you are at high risk for HIV. Your health care provider  may recommend a prescription medicine to help prevent HIV infection. If you choose to take medicine to prevent HIV, you should first get tested for HIV. You should then be tested every 3 months for as long as you are taking the medicine. Follow these instructions at home: Alcohol use Do not drink alcohol if your health care provider tells you not to drink. If you drink alcohol: Limit how much you have to 0-2 drinks a day. Know how much alcohol is in your drink. In the [place] one drink equals one 12 oz bottle of beer (355 mL), one 5 oz glass of wine (148 mL), or one 1 oz glass of hard liquor (44 mL). Lifestyle Do not use any products that contain nicotine or tobacco. These products include cigarettes, chewing tobacco, and vaping devices, such as e-cigarettes. If you need help quitting, ask your health care provider. Do not use street drugs. Do not share needles. Ask your health care provider for help if you need support or information about quitting drugs. General instructions Schedule regular health, dental, and eye exams. Stay current with your vaccines. Tell your health care provider if: You often feel depressed. You have ever been abused or do not feel safe at home. Summary Adopting a healthy lifestyle and getting preventive care are important in promoting health and wellness. Follow your health care provider's instructions about healthy diet, exercising, and getting tested or screened for diseases. Follow your health care provider's instructions on monitoring your cholesterol and blood pressure. This information is not intended to replace advice given to you by your health care provider. Make sure you discuss any questions you have with your health care provider. Document Revised: 10/17/2020 Document Reviewed: 10/17/2020 Elsevier Patient Education  2024 Elsevier Inc.  

## 2023-05-24 NOTE — Progress Notes (Unsigned)
Subjective:  Patient ID: Robert Richmond, male    DOB: Nov 16, 1985  Age: 37 y.o. MRN: [id number]  CC: Annual Exam and Gastroesophageal Reflux   HPI Robert Richmond presents for a CPX and f/up ----  Discussed the use of AI scribe software for clinical note transcription with the patient, who gave verbal consent to proceed.  History of Present Illness   The patient presents with a longstanding history of discomfort in the sternum area, which he describes as a constant agitation. This discomfort is not associated with any specific activity but is often exacerbated by anxiety. Over the past two months, the patient has noticed an increase in pain in the same area, particularly after eating. The pain, rated as 4-5 on a scale of 10, is described as a soreness that is uncomfortable but does not interfere with daily activities.  Three days prior to the consultation, the patient experienced a significant twinge or pull in the same area while playing tennis. The following day, he noticed a lump in the area, which has since decreased in size. The patient denies any associated shortness of breath or difficulty breathing.  The patient previously took Zantac daily for heartburn but has been off the medication for the past seven years. He reports no changes in appetite or weight.  The patient also reports occasional pain in the throat, which can radiate up to the neck. He denies any testicular pain or swelling, painful urination, or other systemic symptoms. The patient is in a non-monogamous relationship but has been monogamous for the past two years. He denies any symptoms suggestive of a sexually transmitted infection.       Outpatient Medications Prior to Visit  Medication Sig Dispense Refill   emtricitabine-tenofovir (TRUVADA) 200-300 MG tablet Take 1 tablet by mouth daily. 90 tablet 0   No facility-administered medications prior to visit.    ROS Review of Systems  Constitutional:  Negative for diaphoresis  and fatigue.  HENT: Negative.  Negative for sore throat and trouble swallowing.   Eyes: Negative.   Respiratory:  Negative for cough, chest tightness, shortness of breath and wheezing.   Cardiovascular:  Positive for chest pain. Negative for palpitations and leg swelling.  Gastrointestinal:  Negative for abdominal pain, blood in stool, constipation, diarrhea, nausea and vomiting.  Endocrine: Negative.   Genitourinary: Negative.  Negative for difficulty urinating, dysuria, penile swelling, scrotal swelling and testicular pain.  Musculoskeletal: Negative.   Skin: Negative.  Negative for rash.  Neurological:  Negative for dizziness and weakness.  Hematological:  Negative for adenopathy. Does not bruise/bleed easily.  Psychiatric/Behavioral:  Negative for decreased concentration, dysphoric mood, sleep disturbance and suicidal ideas. The patient is nervous/anxious.     Objective:  BP 116/82 (BP Location: Left Arm, Patient Position: Sitting, Cuff Size: Normal)   Pulse 86   Temp 97.8 F (36.6 C) (Oral)   Ht 5\' 9"  (1.753 m)   Wt 178 lb 6.4 oz (80.9 kg)   SpO2 97%   BMI 26.35 kg/m   BP Readings from Last 3 Encounters:  05/24/23 116/82  11/17/21 120/80  08/23/21 126/82    Wt Readings from Last 3 Encounters:  05/24/23 178 lb 6.4 oz (80.9 kg)  11/17/21 181 lb 12.8 oz (82.5 kg)  06/02/21 177 lb (80.3 kg)    Physical Exam Vitals reviewed.  Constitutional:      General: He is not in acute distress.    Appearance: Normal appearance. He is not toxic-appearing or diaphoretic.  HENT:  Mouth/Throat:     Mouth: Mucous membranes are moist.  Eyes:     General: No scleral icterus.    Conjunctiva/sclera: Conjunctivae normal.  Cardiovascular:     Rate and Rhythm: Normal rate and regular rhythm.     Heart sounds: No murmur heard.    No friction rub. No gallop.  Pulmonary:     Effort: Pulmonary effort is normal.     Breath sounds: No stridor. No wheezing, rhonchi or rales.  Chest:      Chest wall: No mass, deformity, swelling, tenderness, crepitus or edema.  Breasts:    Breasts are symmetrical.  Abdominal:     General: Abdomen is flat.     Palpations: There is no mass.     Tenderness: There is no abdominal tenderness. There is no guarding.     Hernia: No hernia is present.  Musculoskeletal:        General: Normal range of motion.     Cervical back: Neck supple.     Right lower leg: No edema.     Left lower leg: No edema.  Lymphadenopathy:     Cervical: No cervical adenopathy.  Skin:    General: Skin is warm and dry.  Neurological:     General: No focal deficit present.     Mental Status: He is alert. Mental status is at baseline.  Psychiatric:        Attention and Perception: Attention normal.        Mood and Affect: Affect normal. Mood is anxious.        Speech: Speech normal.        Behavior: Behavior normal.        Thought Content: Thought content normal.        Cognition and Memory: Cognition normal.        Judgment: Judgment normal.     Lab Results  Component Value Date   WBC 5.3 05/24/2023   HGB 16.1 05/24/2023   HCT 48.2 05/24/2023   PLT 217.0 05/24/2023   GLUCOSE 99 05/24/2023   CHOL 152 07/14/2020   TRIG 124.0 07/14/2020   HDL 47.10 07/14/2020   LDLCALC 80 07/14/2020   ALT 38 05/24/2023   AST 29 05/24/2023   NA 138 05/24/2023   K 4.5 05/24/2023   CL 103 05/24/2023   CREATININE 1.08 05/24/2023   BUN 16 05/24/2023   CO2 29 05/24/2023   TSH 1.94 05/24/2023    DG Chest 2 View Result Date: 08/14/2017 CLINICAL DATA:  Heart pounding, chest pain EXAM: CHEST - 2 VIEW COMPARISON:  None. FINDINGS: The heart size and mediastinal contours are within normal limits. Both lungs are clear. The visualized skeletal structures are unremarkable. IMPRESSION: No active cardiopulmonary disease. Electronically Signed   By: Jasmine Pang M.D.   On: 08/14/2017 23:03   DG Chest 2 View Result Date: 05/24/2023 CLINICAL DATA:  Pain at superior sternum.  No known  injury. EXAM: CHEST - 2 VIEW COMPARISON:  08/14/2017. FINDINGS: Bilateral lung fields are clear. Bilateral costophrenic angles are clear. Normal cardio-mediastinal silhouette. No acute osseous abnormalities. No displaced fracture of the sternum seen. The soft tissues are within normal limits. IMPRESSION: *No active cardiopulmonary disease. Electronically Signed   By: Jules Schick M.D.   On: 05/24/2023 10:20       Assessment & Plan:  Gastroesophageal reflux disease with esophagitis without hemorrhage -     Voquezna; Take 1 tablet by mouth daily.  Dispense: 90 tablet; Refill: 0 -  Basic metabolic panel; Future -     CBC with Differential/Platelet; Future -     Hepatic function panel; Future -     Hemoccult Cards (X3 cards); Future -     Voquezna; Take 1 tablet by mouth daily.  High risk homosexual behavior -     Urinalysis, Routine w reflex microscopic; Future -     HIV Antibody (routine testing w rflx); Future -     RPR; Future -     Hepatitis B surface antigen; Future -     Descovy; Take 1 tablet by mouth daily.  Dispense: 90 tablet; Refill: 0  Hepatitis B core antibody positive -     Hepatitis B surface antigen; Future  Generalized anxiety disorder -     Basic metabolic panel; Future -     CBC with Differential/Platelet; Future -     TSH; Future  Encounter for general adult medical examination with abnormal findings - Exam completed, labs reviewed, vaccines reviewed, no cancer screenings indicated, pt ed material was given.  -     HIV Antibody (routine testing w rflx); Future  Anterior chest wall pain- Exam, labs, CXR are reassuring. Will treat the GERD. -     DG Chest 2 View; Future     Follow-up: Return in about 3 months (around 08/22/2023).  [name], MD

## 2023-05-25 LAB — RPR: RPR Ser Ql: NONREACTIVE

## 2023-05-25 LAB — HIV ANTIBODY (ROUTINE TESTING W REFLEX): HIV 1&2 Ab, 4th Generation: NONREACTIVE

## 2023-05-25 LAB — HEPATITIS B SURFACE ANTIGEN: Hepatitis B Surface Ag: NONREACTIVE

## 2023-05-27 DIAGNOSIS — M25512 Pain in left shoulder: Secondary | ICD-10-CM | POA: Diagnosis not present

## 2023-05-27 DIAGNOSIS — R079 Chest pain, unspecified: Secondary | ICD-10-CM | POA: Diagnosis not present

## 2023-05-27 DIAGNOSIS — K219 Gastro-esophageal reflux disease without esophagitis: Secondary | ICD-10-CM | POA: Diagnosis not present

## 2023-05-27 DIAGNOSIS — R222 Localized swelling, mass and lump, trunk: Secondary | ICD-10-CM | POA: Diagnosis not present

## 2023-05-27 MED ORDER — DESCOVY 200-25 MG PO TABS: 1.0000 | ORAL_TABLET | Freq: Every day | ORAL | 0 refills | Status: DC

## 2023-08-05 ENCOUNTER — Encounter: Payer: Self-pay | Admitting: Internal Medicine

## 2023-08-05 ENCOUNTER — Other Ambulatory Visit: Payer: Self-pay | Admitting: Internal Medicine

## 2023-08-05 ENCOUNTER — Ambulatory Visit (INDEPENDENT_AMBULATORY_CARE_PROVIDER_SITE_OTHER): Payer: BC Managed Care – PPO

## 2023-08-05 ENCOUNTER — Ambulatory Visit: Payer: BC Managed Care – PPO | Admitting: Internal Medicine

## 2023-08-05 VITALS — BP 110/68 | HR 78 | Temp 98.1°F | Ht 69.0 in | Wt 183.6 lb

## 2023-08-05 DIAGNOSIS — M79674 Pain in right toe(s): Secondary | ICD-10-CM

## 2023-08-05 DIAGNOSIS — R768 Other specified abnormal immunological findings in serum: Secondary | ICD-10-CM | POA: Diagnosis not present

## 2023-08-05 DIAGNOSIS — R300 Dysuria: Secondary | ICD-10-CM | POA: Diagnosis not present

## 2023-08-05 DIAGNOSIS — Z0001 Encounter for general adult medical examination with abnormal findings: Secondary | ICD-10-CM

## 2023-08-05 DIAGNOSIS — Z13220 Encounter for screening for lipoid disorders: Secondary | ICD-10-CM | POA: Diagnosis not present

## 2023-08-05 DIAGNOSIS — J029 Acute pharyngitis, unspecified: Secondary | ICD-10-CM | POA: Insufficient documentation

## 2023-08-05 NOTE — Progress Notes (Unsigned)
 Subjective:  Patient ID: Shahzad Thomann, male    DOB: 09-Apr-1986  Age: 38 y.o. MRN: [id number]  CC: URI   HPI Clenton Esper presents for f/up ----  Discussed the use of AI scribe software for clinical note transcription with the patient, who gave verbal consent to proceed.  History of Present Illness   Demaryius Imran is a 38 year old male who presents with a sore throat and associated symptoms.  He has experienced a sore throat for nine days, progressively worsening, accompanied by nausea, headaches, and head pressure. He describes significant mucus production with congestion but no fever or chills. He uses a humidifier but still wakes with a dry throat, which has recently improved.  He experiences shortness of breath attributed to anxiety, particularly in specific locations, without wheezing. He can jog without difficulty. He describes chest twitching and a sensation of heaviness in his heart during activities like tennis, with a heart rate increase to 125 bpm, but denies irregular heartbeats.  He reports frequent urination without pain or hematuria, though he had a mild burning sensation that resolved over the past three weeks. No vomiting, dysphagia, abdominal pain, diarrhea, rash, or lymphadenopathy.  He discusses right foot pain, attributed to overcompensation for a previous left foot issue, persisting for two and a half months. A masseuse suggested a possible fracture. The pain is located between his toe and the right foot, exacerbated by certain movements.       Outpatient Medications Prior to Visit  Medication Sig Dispense Refill   emtricitabine-tenofovir AF (DESCOVY) 200-25 MG tablet Take 1 tablet by mouth daily. 90 tablet 0   Vonoprazan Fumarate (VOQUEZNA) 20 MG TABS Take 1 tablet by mouth daily. 90 tablet 0   Vonoprazan Fumarate (VOQUEZNA) 20 MG TABS Take 1 tablet by mouth daily.     No facility-administered medications prior to visit.    ROS Review of Systems  Constitutional:   Negative for appetite change, chills, diaphoresis, fatigue and fever.  HENT:  Positive for sore throat. Negative for trouble swallowing.   Respiratory:  Positive for cough. Negative for chest tightness, shortness of breath and wheezing.   Cardiovascular:  Negative for chest pain, palpitations and leg swelling.  Gastrointestinal: Negative.  Negative for abdominal pain, constipation, diarrhea, nausea, rectal pain and vomiting.  Genitourinary:  Positive for dysuria. Negative for difficulty urinating, penile discharge, penile swelling, scrotal swelling and testicular pain.  Musculoskeletal: Negative.   Skin: Negative.  Negative for rash.  Neurological: Negative.  Negative for dizziness, weakness and headaches.  Hematological:  Negative for adenopathy. Does not bruise/bleed easily.  Psychiatric/Behavioral:  Negative for behavioral problems, confusion, sleep disturbance and suicidal ideas. The patient is nervous/anxious.     Objective:  BP 110/68 (BP Location: Right Arm, Patient Position: Sitting, Cuff Size: Normal)   Pulse 78   Temp 98.1 F (36.7 C) (Oral)   Ht 5\' 9"  (1.753 m)   Wt 183 lb 9.6 oz (83.3 kg)   SpO2 98%   BMI 27.11 kg/m   BP Readings from Last 3 Encounters:  08/05/23 110/68  05/24/23 116/82  11/17/21 120/80    Wt Readings from Last 3 Encounters:  08/05/23 183 lb 9.6 oz (83.3 kg)  05/24/23 178 lb 6.4 oz (80.9 kg)  11/17/21 181 lb 12.8 oz (82.5 kg)    Physical Exam Vitals reviewed.  Constitutional:      Appearance: Normal appearance. He is not ill-appearing.  HENT:     Mouth/Throat:     Mouth: Mucous membranes  are moist.     Palate: No mass.     Pharynx: Oropharynx is clear.     Tonsils: No tonsillar exudate or tonsillar abscesses.  Eyes:     General: No scleral icterus.    Conjunctiva/sclera: Conjunctivae normal.  Cardiovascular:     Rate and Rhythm: Normal rate and regular rhythm.     Heart sounds: No murmur heard.    No friction rub. No gallop.   Pulmonary:     Effort: Pulmonary effort is normal.     Breath sounds: No stridor. No wheezing, rhonchi or rales.  Abdominal:     General: Abdomen is flat.     Palpations: There is no mass.     Tenderness: There is no abdominal tenderness. There is no guarding.     Hernia: No hernia is present.  Musculoskeletal:        General: Normal range of motion.     Cervical back: Neck supple.     Right lower leg: No edema.     Left lower leg: No edema.  Lymphadenopathy:     Cervical: No cervical adenopathy.  Skin:    General: Skin is warm and dry.     Findings: No rash.  Neurological:     General: No focal deficit present.     Mental Status: He is alert. Mental status is at baseline.  Psychiatric:        Mood and Affect: Mood normal.        Behavior: Behavior normal.     Lab Results  Component Value Date   WBC 5.3 05/24/2023   HGB 16.1 05/24/2023   HCT 48.2 05/24/2023   PLT 217.0 05/24/2023   GLUCOSE 99 05/24/2023   CHOL 152 07/14/2020   TRIG 124.0 07/14/2020   HDL 47.10 07/14/2020   LDLCALC 80 07/14/2020   ALT 38 05/24/2023   AST 29 05/24/2023   NA 138 05/24/2023   K 4.5 05/24/2023   CL 103 05/24/2023   CREATININE 1.08 05/24/2023   BUN 16 05/24/2023   CO2 29 05/24/2023   TSH 1.94 05/24/2023    DG Chest 2 View Result Date: 08/14/2017 CLINICAL DATA:  Heart pounding, chest pain EXAM: CHEST - 2 VIEW COMPARISON:  None. FINDINGS: The heart size and mediastinal contours are within normal limits. Both lungs are clear. The visualized skeletal structures are unremarkable. IMPRESSION: No active cardiopulmonary disease. Electronically Signed   By: Jasmine Pang M.D.   On: 08/14/2017 23:03    Assessment & Plan:  Pain of great toe, right -     DG Toe Great Right; Future  Dysuria -     RPR; Future -     GC/Chlamydia Probe Amp; Future  Pharyngitis, unspecified etiology -     CBC with Differential/Platelet; Future -     HIV Antibody (routine testing w rflx); Future -     RPR;  Future -     GC/CT Probe, Amp (Throat)  Hepatitis B core antibody positive -     Hepatitis B surface antigen; Future  Encounter for general adult medical examination with abnormal findings -     Lipid panel; Future     Follow-up: No follow-ups on file.  [name], MD

## 2023-08-05 NOTE — Patient Instructions (Signed)
 How to Have Safe Sex Having safe sex means taking steps before and during sex to reduce your risk of: Getting a sexually transmitted infection (STI). Giving your partner an STI. Unwanted or unplanned pregnancy. How to have safe sex Ways you can have safe sex  Limit your sex partners to only one partner who is only having sex with you. Avoid using alcohol and drugs before having sex. Alcohol and drugs can affect your judgment. Before having sex with a new partner: Talk to your partner about past partners, past STIs, and drug use. Get screened for STIs and discuss the results with your partner. Ask your partner to get screened too. Check your body regularly for sores, blisters, rashes, or unusual discharge. If you notice any of these things, call your health care provider. Avoid sexual contact if you have symptoms of an infection or you're being treated for an STI. While having sex, use a condom. Make sure to: Use a condom every time you have vaginal, oral, or anal sex. Both females and males should wear condoms during oral sex. Do not use a male condom and a male condom at the same time during vaginal sex. Using both types at the same time can cause condoms to break. Keep condoms in place from the beginning to the end of sexual activity. Use a latex condom, if possible. Latex condoms offer the best protection. Use only water-based lubricants with a condom. Using petroleum-based lubricants or oils will weaken the condom and increase the chance that it will break. Ways your health care provider can help you have safe sex  See your provider for regular screenings, exams, and tests for STIs. Talk with your provider about what kind of birth control is best for you. Get vaccinated against hepatitis B and human papillomavirus (HPV). If you're at risk of getting human immunodeficiency virus (HIV), talk with your provider about taking a medicine to prevent HIV. You're at risk for HIV if you: Are  a male who has sex with other males. Are sexually active with more than one partner. Take drugs by injection. Have a sex partner who has HIV. Have unprotected sex. Have sex with someone who has sex with both males and females. Follow these instructions at home: Take your medicines only as told. Call your provider if you think you might be pregnant. Call your provider if have any symptoms of an infection. Where to find more information Centers for Disease Control and Prevention (CDC): TonerPromos.no Office on Women's Health: TravelLesson.ca This information is not intended to replace advice given to you by your health care provider. Make sure you discuss any questions you have with your health care provider. Document Revised: 10/17/2022 Document Reviewed: 10/17/2022 Elsevier Patient Education  2024 [employer].

## 2023-08-06 LAB — LIPID PANEL
Cholesterol: 171 mg/dL (ref 0–200)
HDL: 49.9 mg/dL (ref >39.00)
LDL Cholesterol: 97 mg/dL (ref 0–99)
NonHDL: 121.31
Total CHOL/HDL Ratio: 3
Triglycerides: 122 mg/dL (ref 0.0–149.0)
VLDL: 24.4 mg/dL (ref 0.0–40.0)

## 2023-08-06 LAB — CBC WITH DIFFERENTIAL/PLATELET
Basophils Absolute: 0 10*3/uL (ref 0.0–0.1)
Basophils Relative: 0.4 % (ref 0.0–3.0)
Eosinophils Absolute: 0.1 10*3/uL (ref 0.0–0.7)
Eosinophils Relative: 1.2 % (ref 0.0–5.0)
HCT: 46.2 % (ref 39.0–52.0)
Hemoglobin: 15.2 g/dL (ref 13.0–17.0)
Lymphocytes Relative: 31.6 % (ref 12.0–46.0)
Lymphs Abs: 2.2 10*3/uL (ref 0.7–4.0)
MCHC: 33 g/dL (ref 30.0–36.0)
MCV: 86.8 fl (ref 78.0–100.0)
Monocytes Absolute: 0.4 10*3/uL (ref 0.1–1.0)
Monocytes Relative: 6 % (ref 3.0–12.0)
Neutro Abs: 4.3 10*3/uL (ref 1.4–7.7)
Neutrophils Relative %: 60.8 % (ref 43.0–77.0)
Platelets: 242 10*3/uL (ref 150.0–400.0)
RBC: 5.32 Mil/uL (ref 4.22–5.81)
RDW: 13.8 % (ref 11.5–15.5)
WBC: 7.1 10*3/uL (ref 4.0–10.5)

## 2023-08-06 LAB — GC/CHLAMYDIA PROBE, AMP (THROAT)
Chlamydia trachomatis RNA: NOT DETECTED
Neisseria gonorrhoeae RNA: NOT DETECTED

## 2023-08-06 LAB — RPR: RPR Ser Ql: NONREACTIVE

## 2023-08-06 LAB — HIV ANTIBODY (ROUTINE TESTING W REFLEX): HIV 1&2 Ab, 4th Generation: NONREACTIVE

## 2023-08-06 LAB — HEPATITIS B SURFACE ANTIGEN: Hepatitis B Surface Ag: NONREACTIVE

## 2023-08-07 ENCOUNTER — Encounter: Payer: Self-pay | Admitting: Internal Medicine

## 2023-08-07 LAB — GC/CHLAMYDIA PROBE AMP
Chlamydia trachomatis, NAA: NEGATIVE
Neisseria Gonorrhoeae by PCR: NEGATIVE

## 2023-08-08 DIAGNOSIS — L814 Other melanin hyperpigmentation: Secondary | ICD-10-CM | POA: Diagnosis not present

## 2023-08-08 DIAGNOSIS — D225 Melanocytic nevi of trunk: Secondary | ICD-10-CM | POA: Diagnosis not present

## 2023-08-08 DIAGNOSIS — L821 Other seborrheic keratosis: Secondary | ICD-10-CM | POA: Diagnosis not present

## 2023-08-08 DIAGNOSIS — D485 Neoplasm of uncertain behavior of skin: Secondary | ICD-10-CM | POA: Diagnosis not present

## 2023-09-12 ENCOUNTER — Encounter: Payer: Self-pay | Admitting: Internal Medicine

## 2023-09-12 ENCOUNTER — Ambulatory Visit: Admitting: Internal Medicine

## 2023-09-12 ENCOUNTER — Telehealth: Payer: Self-pay

## 2023-09-12 ENCOUNTER — Other Ambulatory Visit: Payer: Self-pay | Admitting: Internal Medicine

## 2023-09-12 VITALS — BP 114/74 | HR 77 | Temp 98.3°F | Resp 16 | Ht 69.0 in | Wt 180.2 lb

## 2023-09-12 DIAGNOSIS — K581 Irritable bowel syndrome with constipation: Secondary | ICD-10-CM | POA: Diagnosis not present

## 2023-09-12 MED ORDER — TRULANCE 3 MG PO TABS: 1.0000 | ORAL_TABLET | Freq: Every day | ORAL | 1 refills | Status: AC

## 2023-09-12 NOTE — Patient Instructions (Signed)
Irritable Bowel Syndrome, Adult  Irritable bowel syndrome (IBS) is a group of symptoms that affects the organs responsible for digestion (gastrointestinal tract, or GI tract). IBS is not one specific disease. To regulate how the GI tract works, the body sends signals back and forth between the intestines and the brain. If you have IBS, there may be a problem with these signals. As a result, the GI tract does not function normally. The intestines may become more sensitive and overreact to certain things. This may be especially true when you eat certain foods or when you are under stress. There are four main types of IBS. These may be determined based on the consistency of your stool (feces): IBS with mostly (predominance of) diarrhea. IBS with predominance of constipation. IBS with mixed bowel habits. This includes both diarrhea and constipation. IBS unclassified. This includes IBS that cannot be categorized into one of the other three main types. It is important to know which type of IBS you have. Certain treatments are more likely to be helpful for certain types of IBS. What are the causes? The exact cause of IBS is not known. What increases the risk? You may have a higher risk for IBS if you: Are male. Are younger than 40 years. Have a family history of IBS. Have a mental health condition, such as depression, anxiety, or post-traumatic stress disorder. Have had a bacterial infection of your GI tract. What are the signs or symptoms? Symptoms of IBS vary from person to person. The main symptom is abdominal pain or discomfort. Other symptoms usually include one or more of the following: Diarrhea, constipation, or both. Swelling or bloating in the abdomen. Feeling full after eating a small or regular-sized meal. Frequent gas. Mucus in the stool. A feeling of having more stool left after a bowel movement. Symptoms tend to come and go. They may be triggered by stress, mental health  conditions, or certain foods. How is this diagnosed? This condition may be diagnosed based on a physical exam, your medical history, and your symptoms. You may have tests, such as: Blood tests. Stool test. Colonoscopy. This is a procedure in which your GI tract is viewed with a long, thin, flexible tube. How is this treated? There is no cure for IBS, but treatment can help relieve symptoms. Treatment depends on the type of IBS you have, and may include: Changes to your diet, such as: Avoiding foods that cause symptoms. Drinking more water. Following a low-FODMAP (fermentable oligosaccharides, disaccharides, monosaccharides, and polyols) diet for up to 6 weeks, or as told by your health care provider. FODMAPs are sugars that are hard for some people to digest. Eating more fiber. Eating small meals at the same times every day. Medicines. These may include: Fiber supplements, if you have constipation. Medicine to control diarrhea (antidiarrheal medicines). Medicine to help control muscle tightening (spasms) in your GI tract (antispasmodic medicines). Medicines to help with mental health conditions, such as antidepressants. Talk therapy or counseling. Working with a dietitian to help create a food plan that is right for you. Managing your stress. Follow these instructions at home: Eating and drinking  Eat a healthy diet. Eat 5-6 small meals a day. Try to eat meals at about the same times each day. Do not eat large meals. Gradually eat more fiber-rich foods. These include whole grains, fruits, and vegetables. This may be especially helpful if you have IBS with constipation. Eat a diet low in FODMAPs. You may need to avoid foods such as   citrus fruits, cabbage, garlic, and onions. Drink enough fluid to keep your urine pale yellow. Keep a journal of foods that seem to trigger symptoms. Avoid foods and drinks that: Contain added sugar. Make your symptoms worse. These may include dairy  products, caffeinated drinks, and carbonated drinks. Alcohol use Do not drink alcohol if: Your health care provider tells you not to drink. You are pregnant, may be pregnant, or are planning to become pregnant. If you drink alcohol: Limit how much you have to: 0-1 drink a day for women. 0-2 drinks a day for men. Know how much alcohol is in your drink. In the [place] one drink equals one 12 oz bottle of beer (355 mL), one 5 oz glass of wine (148 mL), or one 1 oz glass of hard liquor (44 mL) General instructions Take over-the-counter and prescription medicines only as told by your health care provider. This includes supplements. Get enough exercise. Do at least 150 minutes of moderate-intensity exercise each week. Manage your stress. Getting enough sleep and exercise can help you manage stress. Keep all follow-up visits. This is important. This includes all visits with your health care provider and therapist. Where to find more information International Foundation for Functional Gastrointestinal Disorders: aboutibs.org National Institute of Diabetes and Digestive and Kidney Diseases: niddk.nih.gov Contact a health care provider if: You have constant pain. You lose weight. You have diarrhea that gets worse. You have bleeding from the rectum. You vomit often. You have a fever. Get help right away if: You have severe abdominal pain. You have diarrhea with symptoms of dehydration, such as dizziness or dry mouth. You have bloody or black stools. You have severe abdominal bloating. You have vomiting that does not stop. You have blood in your vomit. Summary Irritable bowel syndrome (IBS) is not one specific disease. It is a group of symptoms that affects digestion. Your intestines may become more sensitive and overreact to certain things. This may be especially true when you eat certain foods or when you are under stress. There is no cure for IBS, but treatment can help relieve  symptoms. This information is not intended to replace advice given to you by your health care provider. Make sure you discuss any questions you have with your health care provider. Document Revised: 05/10/2021 Document Reviewed: 05/10/2021 Elsevier Patient Education  2024 Elsevier Inc.  

## 2023-09-12 NOTE — Progress Notes (Unsigned)
 Subjective:  Patient ID: Robert Richmond, male    DOB: 06/27/1985  Age: 38 y.o. MRN: [id number]  CC: Abdominal Pain   HPI Robert Richmond presents for f/up ----  Discussed the use of AI scribe software for clinical note transcription with the patient, who gave verbal consent to proceed.  History of Present Illness   Robert Richmond is a 38 year old male who presents with abdominal twitching and irregular sensations.  He has been experiencing abdominal twitching and irregular sensations for approximately seven to ten days. The twitching began suddenly and is described as a pulsating sensation in the stomach area, accompanied by increased gas. No nausea, vomiting, bloody diarrhea, or pain is associated with these symptoms.  He has a history of stomach issues, including acid reflux, and recently changed his diet to include more organic and natural foods about a month and a half ago. He uses apple cider vinegar with orange and lemon juice to manage occasional heartburn, particularly after eating certain foods like [place] cuisine.  He feels more regular with bowel movements recently, although he often feels constipated. He does not take any specific medication for bowel movements.  He describes additional twitching sensations throughout his body, which he attributes to muscle tightness possibly related to anxiety. These sensations are not localized and occur sporadically. He has not been stretching as much recently, which he believes may contribute to the tightness.  He experiences no pain at night while sleeping, and his symptoms tend to worsen with stress or physical activity. He has been more active recently, playing tennis and exercising, which he feels has helped improve his symptoms.       No outpatient medications prior to visit.   No facility-administered medications prior to visit.    ROS Review of Systems  Objective:  BP 114/74 (BP Location: Left Arm, Patient Position: Sitting, Cuff Size:  Normal)   Pulse 77   Temp 98.3 F (36.8 C) (Oral)   Resp 16   Ht 5\' 9"  (1.753 m)   Wt 180 lb 3.2 oz (81.7 kg)   SpO2 97%   BMI 26.61 kg/m   BP Readings from Last 3 Encounters:  09/12/23 114/74  08/05/23 110/68  05/24/23 116/82    Wt Readings from Last 3 Encounters:  09/12/23 180 lb 3.2 oz (81.7 kg)  08/05/23 183 lb 9.6 oz (83.3 kg)  05/24/23 178 lb 6.4 oz (80.9 kg)    Physical Exam Vitals reviewed.  Constitutional:      Appearance: Normal appearance.  HENT:     Mouth/Throat:     Mouth: Mucous membranes are moist.  Eyes:     General: No scleral icterus.    Conjunctiva/sclera: Conjunctivae normal.  Cardiovascular:     Rate and Rhythm: Normal rate and regular rhythm.  Pulmonary:     Effort: No respiratory distress.     Breath sounds: No stridor. No wheezing or rhonchi.  Abdominal:     General: Abdomen is flat. Bowel sounds are normal. There is no distension.     Palpations: Abdomen is soft. There is no hepatomegaly, splenomegaly or mass.     Tenderness: There is no abdominal tenderness.  Musculoskeletal:        General: Normal range of motion.     Cervical back: Neck supple.     Right lower leg: No edema.     Left lower leg: No edema.  Lymphadenopathy:     Cervical: No cervical adenopathy.  Skin:    General: Skin is  warm and dry.  Neurological:     General: No focal deficit present.     Mental Status: He is alert. Mental status is at baseline.  Psychiatric:        Mood and Affect: Mood normal.        Behavior: Behavior normal.     Lab Results  Component Value Date   WBC 7.1 08/05/2023   HGB 15.2 08/05/2023   HCT 46.2 08/05/2023   PLT 242.0 08/05/2023   GLUCOSE 99 05/24/2023   CHOL 171 08/05/2023   TRIG 122.0 08/05/2023   HDL 49.90 08/05/2023   LDLCALC 97 08/05/2023   ALT 38 05/24/2023   AST 29 05/24/2023   NA 138 05/24/2023   K 4.5 05/24/2023   CL 103 05/24/2023   CREATININE 1.08 05/24/2023   BUN 16 05/24/2023   CO2 29 05/24/2023   TSH 1.94  05/24/2023    DG Chest 2 View Result Date: 08/14/2017 CLINICAL DATA:  Heart pounding, chest pain EXAM: CHEST - 2 VIEW COMPARISON:  None. FINDINGS: The heart size and mediastinal contours are within normal limits. Both lungs are clear. The visualized skeletal structures are unremarkable. IMPRESSION: No active cardiopulmonary disease. Electronically Signed   By: Jasmine Pang M.D.   On: 08/14/2017 23:03    Assessment & Plan:  Irritable bowel syndrome with constipation -     Trulance; Take 1 tablet (3 mg total) by mouth daily.  Dispense: 90 tablet; Refill: 1     Follow-up: Return in about 6 months (around 03/13/2024).  [name], MD

## 2023-09-12 NOTE — Telephone Encounter (Signed)
 Called patient to get him scheduled per Dr. Yetta Barre wishes. Unable to reach patient. LMTRC. If he call back please get him scheduled for 2:20 per Dr. Yetta Barre request,.

## 2023-09-12 NOTE — Telephone Encounter (Signed)
-----   Message from [name] sent at 09/12/2023  7:52 AM EDT ----- Regarding: add on Please add him to my schedule today for abd pain

## 2023-09-16 ENCOUNTER — Other Ambulatory Visit (HOSPITAL_COMMUNITY): Payer: Self-pay

## 2023-09-16 ENCOUNTER — Telehealth: Payer: Self-pay

## 2023-09-16 NOTE — Telephone Encounter (Signed)
 Pharmacy Patient Advocate Encounter  Received notification from [facility] that Prior Authorization for Trulance 3 mg tablets has been APPROVED from 09/16/23 to 09/15/24. Ran test claim, Copay is $25. This test claim was processed through [facility]- copay amounts may vary at other pharmacies due to pharmacy/plan contracts, or as the patient moves through the different stages of their insurance plan.   PA #/Case ID/Reference #: [id number]

## 2023-09-16 NOTE — Telephone Encounter (Signed)
 Pharmacy Patient Advocate Encounter   Received notification from CoverMyMeds that prior authorization for Trulance 3MG  tablets is required/requested.   Insurance verification completed.   The patient is insured through [facility] .   Per test claim: PA required; PA submitted to above mentioned insurance via CoverMyMeds Key/confirmation #/EOC [id number] Status is pending

## 2023-09-24 DIAGNOSIS — R10814 Left lower quadrant abdominal tenderness: Secondary | ICD-10-CM | POA: Diagnosis not present

## 2023-09-24 DIAGNOSIS — S39011A Strain of muscle, fascia and tendon of abdomen, initial encounter: Secondary | ICD-10-CM | POA: Diagnosis not present

## 2023-09-24 DIAGNOSIS — R1013 Epigastric pain: Secondary | ICD-10-CM | POA: Diagnosis not present

## 2023-09-24 DIAGNOSIS — M62830 Muscle spasm of back: Secondary | ICD-10-CM | POA: Diagnosis not present

## 2024-01-28 DIAGNOSIS — N978 Female infertility of other origin: Secondary | ICD-10-CM | POA: Diagnosis not present

## 2024-02-08 ENCOUNTER — Other Ambulatory Visit: Payer: Self-pay | Admitting: Internal Medicine

## 2024-02-12 DIAGNOSIS — Z31440 Encounter of male for testing for genetic disease carrier status for procreative management: Secondary | ICD-10-CM | POA: Diagnosis not present

## 2024-02-12 DIAGNOSIS — Z31441 Encounter for testing of male partner of patient with recurrent pregnancy loss: Secondary | ICD-10-CM | POA: Diagnosis not present

## 2024-02-12 DIAGNOSIS — Z3141 Encounter for fertility testing: Secondary | ICD-10-CM | POA: Diagnosis not present

## 2024-03-31 DIAGNOSIS — Z3184 Encounter for fertility preservation procedure: Secondary | ICD-10-CM | POA: Diagnosis not present

## 2024-03-31 DIAGNOSIS — Z3141 Encounter for fertility testing: Secondary | ICD-10-CM | POA: Diagnosis not present
